# Patient Record
Sex: Female | Born: 1935 | Race: White | Hispanic: No | Smoking: Never smoker
Health system: Southern US, Community
[De-identification: ages and names within clinical notes are randomized; demographics above are authoritative.]

## PROBLEM LIST (undated history)

## (undated) DIAGNOSIS — R251 Tremor, unspecified: Secondary | ICD-10-CM

## (undated) DIAGNOSIS — I4891 Unspecified atrial fibrillation: Secondary | ICD-10-CM

## (undated) DIAGNOSIS — E785 Hyperlipidemia, unspecified: Secondary | ICD-10-CM

## (undated) DIAGNOSIS — I739 Peripheral vascular disease, unspecified: Secondary | ICD-10-CM

## (undated) DIAGNOSIS — F0390 Unspecified dementia without behavioral disturbance: Secondary | ICD-10-CM

## (undated) DIAGNOSIS — R350 Frequency of micturition: Secondary | ICD-10-CM

---

## 2014-06-08 ENCOUNTER — Inpatient Hospital Stay (HOSPITAL_BASED_OUTPATIENT_CLINIC_OR_DEPARTMENT_OTHER)
Admission: EM | Admit: 2014-06-08 | Discharge: 2014-06-11 | DRG: 872 | Disposition: A | Discharge status: Home / Self Care | Payer: Medicare Other | Attending: Internal Medicine | Admitting: Internal Medicine

## 2014-06-08 ENCOUNTER — Emergency Department (HOSPITAL_BASED_OUTPATIENT_CLINIC_OR_DEPARTMENT_OTHER): Payer: Medicare Other

## 2014-06-08 ENCOUNTER — Inpatient Hospital Stay (HOSPITAL_COMMUNITY): Payer: Medicare Other

## 2014-06-08 ENCOUNTER — Encounter (HOSPITAL_BASED_OUTPATIENT_CLINIC_OR_DEPARTMENT_OTHER): Payer: Self-pay | Admitting: Emergency Medicine

## 2014-06-08 DIAGNOSIS — Z79899 Other long term (current) drug therapy: Secondary | ICD-10-CM | POA: Diagnosis not present

## 2014-06-08 DIAGNOSIS — Z66 Do not resuscitate: Secondary | ICD-10-CM | POA: Diagnosis present

## 2014-06-08 DIAGNOSIS — F0390 Unspecified dementia without behavioral disturbance: Secondary | ICD-10-CM | POA: Diagnosis present

## 2014-06-08 DIAGNOSIS — I739 Peripheral vascular disease, unspecified: Secondary | ICD-10-CM | POA: Diagnosis present

## 2014-06-08 DIAGNOSIS — E785 Hyperlipidemia, unspecified: Secondary | ICD-10-CM | POA: Diagnosis present

## 2014-06-08 DIAGNOSIS — Z7901 Long term (current) use of anticoagulants: Secondary | ICD-10-CM

## 2014-06-08 DIAGNOSIS — R259 Unspecified abnormal involuntary movements: Secondary | ICD-10-CM | POA: Diagnosis present

## 2014-06-08 DIAGNOSIS — Z885 Allergy status to narcotic agent status: Secondary | ICD-10-CM | POA: Diagnosis not present

## 2014-06-08 DIAGNOSIS — Z888 Allergy status to other drugs, medicaments and biological substances status: Secondary | ICD-10-CM

## 2014-06-08 DIAGNOSIS — N12 Tubulo-interstitial nephritis, not specified as acute or chronic: Secondary | ICD-10-CM | POA: Diagnosis present

## 2014-06-08 DIAGNOSIS — I10 Essential (primary) hypertension: Secondary | ICD-10-CM | POA: Diagnosis present

## 2014-06-08 DIAGNOSIS — A419 Sepsis, unspecified organism: Secondary | ICD-10-CM | POA: Diagnosis present

## 2014-06-08 DIAGNOSIS — I4891 Unspecified atrial fibrillation: Secondary | ICD-10-CM | POA: Diagnosis present

## 2014-06-08 DIAGNOSIS — R109 Unspecified abdominal pain: Secondary | ICD-10-CM | POA: Diagnosis present

## 2014-06-08 HISTORY — DX: Unspecified atrial fibrillation: I48.91

## 2014-06-08 HISTORY — DX: Hyperlipidemia, unspecified: E78.5

## 2014-06-08 HISTORY — DX: Tremor, unspecified: R25.1

## 2014-06-08 HISTORY — DX: Peripheral vascular disease, unspecified: I73.9

## 2014-06-08 HISTORY — DX: Frequency of micturition: R35.0

## 2014-06-08 HISTORY — DX: Unspecified dementia, unspecified severity, without behavioral disturbance, psychotic disturbance, mood disturbance, and anxiety: F03.90

## 2014-06-08 LAB — URINALYSIS, ROUTINE W REFLEX MICROSCOPIC
BILIRUBIN URINE: NEGATIVE
Glucose, UA: NEGATIVE mg/dL
Ketones, ur: NEGATIVE mg/dL
Nitrite: POSITIVE — AB
PROTEIN: NEGATIVE mg/dL
Specific Gravity, Urine: 1.009 (ref 1.005–1.030)
UROBILINOGEN UA: 0.2 mg/dL (ref 0.0–1.0)
pH: 6 (ref 5.0–8.0)

## 2014-06-08 LAB — TROPONIN I

## 2014-06-08 LAB — BASIC METABOLIC PANEL
ANION GAP: 16 — AB (ref 5–15)
BUN: 18 mg/dL (ref 6–23)
CO2: 24 meq/L (ref 19–32)
Calcium: 10.3 mg/dL (ref 8.4–10.5)
Chloride: 100 mEq/L (ref 96–112)
Creatinine, Ser: 0.8 mg/dL (ref 0.50–1.10)
GFR calc Af Amer: 75 mL/min — ABNORMAL LOW (ref >90)
GFR calc non Af Amer: 64 mL/min — ABNORMAL LOW (ref >90)
Glucose, Bld: 128 mg/dL — ABNORMAL HIGH (ref 70–99)
Potassium: 4 mEq/L (ref 3.7–5.3)
SODIUM: 140 meq/L (ref 137–147)

## 2014-06-08 LAB — CBC WITH DIFFERENTIAL/PLATELET
BASOS ABS: 0 10*3/uL (ref 0.0–0.1)
Basophils Relative: 0 % (ref 0–1)
Eosinophils Absolute: 0.1 10*3/uL (ref 0.0–0.7)
Eosinophils Relative: 1 % (ref 0–5)
HCT: 40.6 % (ref 36.0–46.0)
Hemoglobin: 13 g/dL (ref 12.0–15.0)
LYMPHS ABS: 1.7 10*3/uL (ref 0.7–4.0)
LYMPHS PCT: 14 % (ref 12–46)
MCH: 30.4 pg (ref 26.0–34.0)
MCHC: 32 g/dL (ref 30.0–36.0)
MCV: 94.9 fL (ref 78.0–100.0)
Monocytes Absolute: 1 10*3/uL (ref 0.1–1.0)
Monocytes Relative: 8 % (ref 3–12)
NEUTROS ABS: 9.7 10*3/uL — AB (ref 1.7–7.7)
Neutrophils Relative %: 77 % (ref 43–77)
Platelets: 213 10*3/uL (ref 150–400)
RBC: 4.28 MIL/uL (ref 3.87–5.11)
RDW: 14.9 % (ref 11.5–15.5)
WBC: 12.5 10*3/uL — AB (ref 4.0–10.5)

## 2014-06-08 LAB — HEPATIC FUNCTION PANEL
ALBUMIN: 3.9 g/dL (ref 3.5–5.2)
ALT: 12 U/L (ref 0–35)
AST: 20 U/L (ref 0–37)
Alkaline Phosphatase: 92 U/L (ref 39–117)
Bilirubin, Direct: <0.2 mg/dL (ref 0.0–0.3)
Total Bilirubin: 0.9 mg/dL (ref 0.3–1.2)
Total Protein: 7.8 g/dL (ref 6.0–8.3)

## 2014-06-08 LAB — URINE MICROSCOPIC-ADD ON

## 2014-06-08 MED ORDER — DILTIAZEM HCL 25 MG/5ML IV SOLN
INTRAVENOUS | Status: AC
  Filled 2014-06-08: qty 20

## 2014-06-08 MED ORDER — AMIODARONE HCL IN DEXTROSE 360-4.14 MG/200ML-% IV SOLN
30.0000 mg/h | INTRAVENOUS | Status: DC
  Administered 2014-06-08 – 2014-06-09 (×2): 30 mg/h via INTRAVENOUS
  Filled 2014-06-08 (×7): qty 200

## 2014-06-08 MED ORDER — APIXABAN 2.5 MG PO TABS
2.5000 mg | ORAL_TABLET | Freq: Two times a day (BID) | ORAL | Status: DC
  Administered 2014-06-08 – 2014-06-11 (×6): 2.5 mg via ORAL
  Filled 2014-06-08 (×7): qty 1

## 2014-06-08 MED ORDER — ACETAMINOPHEN 325 MG PO TABS
650.0000 mg | ORAL_TABLET | Freq: Once | ORAL | Status: AC
  Administered 2014-06-08: 650 mg via ORAL

## 2014-06-08 MED ORDER — AMIODARONE HCL IN DEXTROSE 360-4.14 MG/200ML-% IV SOLN
60.0000 mg/h | INTRAVENOUS | Status: AC
Start: 2014-06-08 — End: 2014-06-08
  Administered 2014-06-08: 60 mg/h via INTRAVENOUS
  Filled 2014-06-08: qty 200

## 2014-06-08 MED ORDER — AMIODARONE LOAD VIA INFUSION
150.0000 mg | Freq: Once | INTRAVENOUS | Status: AC
  Administered 2014-06-08: 150 mg via INTRAVENOUS
  Filled 2014-06-08: qty 83.34

## 2014-06-08 MED ORDER — SODIUM CHLORIDE 0.9 % IV BOLUS (SEPSIS)
1000.0000 mL | Freq: Once | INTRAVENOUS | Status: AC
  Administered 2014-06-08: 1000 mL via INTRAVENOUS

## 2014-06-08 MED ORDER — APIXABAN 5 MG PO TABS
5.0000 mg | ORAL_TABLET | Freq: Two times a day (BID) | ORAL | Status: DC
  Filled 2014-06-08: qty 1

## 2014-06-08 MED ORDER — SODIUM CHLORIDE 0.9 % IV SOLN
INTRAVENOUS | Status: DC
  Administered 2014-06-08 – 2014-06-09 (×2): via INTRAVENOUS

## 2014-06-08 MED ORDER — ACETAMINOPHEN 325 MG PO TABS: 650.0000 mg | ORAL_TABLET | Freq: Four times a day (QID) | ORAL | Status: DC | PRN

## 2014-06-08 MED ORDER — LISINOPRIL 10 MG PO TABS
10.0000 mg | ORAL_TABLET | Freq: Every day | ORAL | Status: DC
  Administered 2014-06-08 – 2014-06-11 (×4): 10 mg via ORAL
  Filled 2014-06-08 (×4): qty 1

## 2014-06-08 MED ORDER — DILTIAZEM LOAD VIA INFUSION
15.0000 mg | Freq: Once | INTRAVENOUS | Status: AC
  Administered 2014-06-08: 15 mg via INTRAVENOUS
  Filled 2014-06-08: qty 15

## 2014-06-08 MED ORDER — DEXTROSE 5 % IV SOLN
1.0000 g | Freq: Once | INTRAVENOUS | Status: AC
  Administered 2014-06-08: 1 g via INTRAVENOUS

## 2014-06-08 MED ORDER — FUROSEMIDE 10 MG/ML IJ SOLN
20.0000 mg | Freq: Once | INTRAMUSCULAR | Status: AC
  Administered 2014-06-08: 20 mg via INTRAVENOUS
  Filled 2014-06-08: qty 2

## 2014-06-08 MED ORDER — DEXTROSE 5 % IV SOLN
1.0000 g | INTRAVENOUS | Status: DC
  Administered 2014-06-09 – 2014-06-11 (×3): 1 g via INTRAVENOUS
  Filled 2014-06-08 (×3): qty 10

## 2014-06-08 MED ORDER — LISINOPRIL 10 MG PO TABS: 10.0000 mg | ORAL_TABLET | Freq: Every day | ORAL | Status: DC

## 2014-06-08 MED ORDER — ALBUTEROL SULFATE (2.5 MG/3ML) 0.083% IN NEBU
INHALATION_SOLUTION | RESPIRATORY_TRACT | Status: AC
  Administered 2014-06-08: 2.5 mg
  Filled 2014-06-08: qty 6

## 2014-06-08 MED ORDER — ONDANSETRON HCL 4 MG/2ML IJ SOLN
4.0000 mg | Freq: Four times a day (QID) | INTRAMUSCULAR | Status: DC | PRN
  Administered 2014-06-10: 4 mg via INTRAVENOUS
  Filled 2014-06-08 (×2): qty 2

## 2014-06-08 MED ORDER — DEXTROSE 5 % IV SOLN
5.0000 mg/h | INTRAVENOUS | Status: DC
  Administered 2014-06-08: 5 mg/h via INTRAVENOUS
  Administered 2014-06-08: 15 mg/h via INTRAVENOUS
  Filled 2014-06-08: qty 100

## 2014-06-08 MED ORDER — APIXABAN 5 MG PO TABS
5.0000 mg | ORAL_TABLET | Freq: Two times a day (BID) | ORAL | Status: DC
  Administered 2014-06-08: 5 mg via ORAL
  Filled 2014-06-08 (×2): qty 1

## 2014-06-08 MED ORDER — ACETAMINOPHEN 325 MG PO TABS
ORAL_TABLET | ORAL | Status: AC
  Administered 2014-06-08: 650 mg via ORAL
  Filled 2014-06-08: qty 2

## 2014-06-08 MED ORDER — LEVALBUTEROL HCL 1.25 MG/0.5ML IN NEBU
1.2500 mg | INHALATION_SOLUTION | Freq: Four times a day (QID) | RESPIRATORY_TRACT | Status: DC | PRN
  Administered 2014-06-10: 1.25 mg via RESPIRATORY_TRACT
  Filled 2014-06-08: qty 0.5

## 2014-06-08 MED ORDER — CEFTRIAXONE SODIUM 1 G IJ SOLR
INTRAMUSCULAR | Status: AC
  Filled 2014-06-08: qty 10

## 2014-06-08 NOTE — Progress Notes (Signed)
Both Peripheral IV's have infiltrated.  IV Amiodarone gtt has been stopped for 1.5 hrs.  IV team notified.

## 2014-06-08 NOTE — ED Provider Notes (Signed)
CSN: [id number]     Arrival date & time 06/08/14  0203 History   First MD Initiated Contact with Patient 06/08/14 [phone]     Chief Complaint  Patient presents with  . Shortness of Breath     (Consider location/radiation/quality/duration/timing/severity/associated sxs/prior Treatment) HPI 80 year old female presents to emergency department from a local hotel with complaint of wheezing, shortness of breath, and shaking.  Family is in town for a wedding from [place].  They report that she had a normal day, had a Medicare pedicure.  Upon coming back to the hotel they were getting ready for bed and noticed that she was shaking so bad and thought it might be a seizure.  No change in all consciousness.  Patient has history of dementia.  They noticed the patient was wheezing.  Patient has history of atrial fibrillation, hyperlipidemia peripheral vascular disease and frequent urinary tract infections.  They report patient was recently switched from Coumadin to eloquent on Wednesday.  The ulcer report that she was recently taken off her Tikosyn as on her last evaluation by her cardiologist she did not have atrial fibrillation.  Patient has been complaining of abdominal pain.  Failure reports she has frequent urination, wears depends.  No recent antibiotics, but does have history of frequent urinary tract infections. Past Medical History  Diagnosis Date  . Atrial fibrillation   . Hyperlipemia   . PVD (peripheral vascular disease)   . Tremor   . Dementia   . Urinary frequency    History reviewed. No pertinent past surgical history. History reviewed. No pertinent family history. History  Substance Use Topics  . Smoking status: Never Smoker   . Smokeless tobacco: Not on file  . Alcohol Use: No   OB History   Grav Para Term Preterm Abortions TAB SAB Ect Mult Living                 Review of Systems  Unable to perform ROS: Dementia      Allergies  Advil; Codeine; Diovan; Iodine;  and Morphine and related  Home Medications   Prior to Admission medications   Medication Sig Start Date End Date Taking? Authorizing Provider  apixaban (ELIQUIS) 5 MG TABS tablet Take 5 mg by mouth 2 (two) times daily.   Yes Historical Provider, MD  calcium citrate-vitamin D (CITRACAL+D) 315-200 MG-UNIT per tablet Take 1 tablet by mouth 2 (two) times daily.   Yes Historical Provider, MD  fluticasone (FLONASE) 50 MCG/ACT nasal spray Place into both nostrils daily.   Yes Historical Provider, MD  lisinopril (PRINIVIL,ZESTRIL) 10 MG tablet Take 10 mg by mouth daily.   Yes Historical Provider, MD   BP 162/100  Pulse 155  Temp(Src) 101.3 F (38.5 C) (Oral)  Resp 18  SpO2 97% Physical Exam  Nursing note and vitals reviewed. Constitutional: She is oriented to person, place, and time. She appears well-developed and well-nourished. She appears distressed (uncomfortable appearing).  HENT:  Head: Normocephalic and atraumatic.  Right Ear: External ear normal.  Left Ear: External ear normal.  Nose: Nose normal.  Mouth/Throat: Oropharynx is clear and moist.  Eyes: Conjunctivae and EOM are normal. Pupils are equal, round, and reactive to light.  Neck: Normal range of motion. Neck supple. No JVD present. No tracheal deviation present. No thyromegaly present.  Cardiovascular: Normal rate, regular rhythm, normal heart sounds and intact distal pulses.  Exam reveals no gallop and no friction rub.   No murmur heard. Pulmonary/Chest: Effort normal and breath sounds normal.  No stridor. No respiratory distress. She has no wheezes. She has no rales. She exhibits no tenderness.  Abdominal: Soft. Bowel sounds are normal. She exhibits distension (abdomen is distended and firm). She exhibits no mass. There is tenderness (diffuse tenderness worse in left mid abdomen and left lower quadrant). There is no rebound and no guarding.  Musculoskeletal: Normal range of motion. She exhibits no edema and no tenderness.   Lymphadenopathy:    She has no cervical adenopathy.  Neurological: She is alert and oriented to person, place, and time. She exhibits normal muscle tone. Coordination normal.  Skin: Skin is warm and dry. No rash noted. No erythema. No pallor.  Psychiatric: She has a normal mood and affect. Her behavior is normal. Judgment and thought content normal.    ED Course  Procedures (including critical care time) Labs Review Labs Reviewed  CBC WITH DIFFERENTIAL - Abnormal; Notable for the following:    WBC 12.5 (*)    Neutro Abs 9.7 (*)    All other components within normal limits  BASIC METABOLIC PANEL - Abnormal; Notable for the following:    Glucose, Bld 128 (*)    GFR calc non Af Amer 64 (*)    GFR calc Af Amer 75 (*)    Anion gap 16 (*)    All other components within normal limits  URINALYSIS, ROUTINE W REFLEX MICROSCOPIC - Abnormal; Notable for the following:    APPearance TURBID (*)    Hgb urine dipstick LARGE (*)    Nitrite POSITIVE (*)    Leukocytes, UA LARGE (*)    All other components within normal limits  URINE MICROSCOPIC-ADD ON - Abnormal; Notable for the following:    Bacteria, UA MANY (*)    All other components within normal limits  URINE CULTURE  TROPONIN I  HEPATIC FUNCTION PANEL    Imaging Review Dg Chest Portable 1 View  06/08/2014   CLINICAL DATA:  SHORTNESS OF BREATH SHORTNESS OF BREATH  EXAM: PORTABLE CHEST - 1 VIEW  COMPARISON:  None available  FINDINGS: Transverse heart size is at the upper limits of normal. Mediastinal silhouette within normal limits. Atherosclerotic calcifications present within the aortic arch.  The lungs are normally inflated. No airspace consolidation, pleural effusion, or pulmonary edema is identified. There is no pneumothorax.  No acute osseous abnormality identified.  IMPRESSION: No active cardiopulmonary disease.   Electronically Signed   By: Rise Mu M.D.   On: 06/08/2014 03:00     EKG Interpretation   Date/Time:   Friday June 08 2014 02:23:29 EDT Ventricular Rate:  158 PR Interval:    QRS Duration: 84 QT Interval:  318 QTC Calculation: 515 R Axis:   85 Text Interpretation:  Atrial fibrillation with rapid ventricular response  with premature ventricular or aberrantly conducted complexes Septal  infarct , age undetermined Abnormal ECG No old tracing to compare  Confirmed by Nikesh Teschner  MD, Rontrell Moquin ([id number]) on 06/08/2014 2:36:15 AM     CRITICAL CARE Performed by: Olivia Mackie Total critical care time: 30 min Critical care time was exclusive of separately billable procedures and treating other patients. Critical care was necessary to treat or prevent imminent or life-threatening deterioration. Critical care was time spent personally by me on the following activities: development of treatment plan with patient and/or surrogate as well as nursing, discussions with consultants, evaluation of patient's response to treatment, examination of patient, obtaining history from patient or surrogate, ordering and performing treatments and interventions, ordering and review of laboratory studies,  ordering and review of radiographic studies, pulse oximetry and re-evaluation of patient's condition.  MDM   Final diagnoses:  Pyelonephritis  Atrial fibrillation with rapid ventricular response    80 year old female who presents after shaking, or wheezing.  Patient noted to be in atrial fibrillation with RVR, heart rate in the 150s.  She is febrile to 101.3.  Patient has diffuse tenderness on palpation of the abdomen worse on the left side.  Urine with infection noted.  Will treat fever, give fluids.  May require some rate control if he does not improve with fever reduction.  As abdomen is distended and tender, we'll get CT abdomen pelvis.  Failure reports her history of diverticulitis.  There is an allergy to iodine, but neither patient nor family knows what the reaction is.  We'll do CT without IV contrast oral contrast  only.  5:09 AM Some improvement in HR with reduction in fever and fluid bolus, but still persistently in 130s.  Improved to 100s with diltiazem.  CT scan shows probably pyelo given fever, elevated wbc with UTI.  Will start rocephin and d/w hospitalist for admission.  Will update family when they return.  Olivia Mackie, MD 06/08/14 [phone]

## 2014-06-08 NOTE — Care Management Note (Signed)
    Page 1 of 1   06/11/2014     2:41:48 PM CARE MANAGEMENT NOTE 06/11/2014  Patient:  Yvonne Gates, Yvonne Gates   Account Number:  [id number]  Date Initiated:  06/08/2014  Documentation initiated by:  GRAVES-BIGELOW,Jaquawn Saffran  Subjective/Objective Assessment:   Pt admitted with chills and abdominal pain.     Action/Plan:   CM will continue to monitor for disposition needs.   Anticipated DC Date:  06/11/2014   Anticipated DC Plan:  HOME W HOME HEALTH SERVICES      DC Planning Services  CM consult      Choice offered to / List presented to:             Status of service:  Completed, signed off Medicare Important Message given?  YES (If response is "NO", the following Medicare IM given date fields will be blank) Date Medicare IM given:  06/11/2014 Medicare IM given by:  GRAVES-BIGELOW,Aarik Blank Date Additional Medicare IM given:   Additional Medicare IM given by:    Discharge Disposition:  HOME/SELF CARE  Per UR Regulation:  Reviewed for med. necessity/level of care/duration of stay  If discussed at Long Length of Stay Meetings, dates discussed:    Comments:

## 2014-06-08 NOTE — ED Notes (Signed)
Daughter Duard Larsen contact number [phone].

## 2014-06-08 NOTE — Progress Notes (Signed)
UR Completed Jolina Symonds Graves-Bigelow, RN,BSN [phone]  

## 2014-06-08 NOTE — H&P (Signed)
[facility] History and Physical  Yvonne Gates ZOX:[id number] DOB: [birth year] DOA: 06/08/2014  Referring physician: EDP PCP: Pcp Not In System   Chief Complaint: chills, abd pain  HPI: Yvonne Gates is a 80 y.o. female with PMH of Dementia, H/o Afib, HTN, tremors resident of ALF is visiting [place] for a wedding from [place] with her daughter. She was doing welll during the day yetserday and last night started experiencing chills, L sided abd pain, frequent urination and some wheezing. -Her daughter brought her to the ER and was found to be febrile with temp of 101, Afib with RVR, leukocytosis and UTI with pyelonephritis on CT. Off note, she saw her cardiologist last week in [place] and her Tikosyn was stopped and switched from Coumadin to Apixaban   Review of Systems: positives bolded Constitutional:  No weight loss, night sweats, Fevers, chills, fatigue.  HEENT:  No headaches, Difficulty swallowing,Tooth/dental problems,Sore throat,  No sneezing, itching, ear ache, nasal congestion, post nasal drip,  Cardio-vascular:  No chest pain, Orthopnea, PND, swelling in lower extremities, anasarca, dizziness, palpitations  GI:  No heartburn, indigestion, abdominal pain, nausea, vomiting, diarrhea, change in bowel habits, loss of appetite  Resp:   shortness of breath with exertion or at rest. No excess mucus, no productive cough, No non-productive cough, No coughing up of blood.No change in color of mucus.No wheezing.No chest wall deformity  Skin:  no rash or lesions.  GU:  no dysuria, change in color of urine, no urgency or frequency. No flank pain.  Musculoskeletal:  No joint pain or swelling. No decreased range of motion. No back pain.  Psych:  No change in mood or affect. No depression or anxiety. No memory loss.   Past Medical History  Diagnosis Date  . Atrial fibrillation   . Hyperlipemia   . PVD (peripheral vascular disease)   . Tremor   . Dementia   . Urinary  frequency    History reviewed. No pertinent past surgical history. Social History:  reports that she has never smoked. She does not have any smokeless tobacco history on file. She reports that she does not drink alcohol. Her drug history is not on file.  Allergies  Allergen Reactions  . Advil [Ibuprofen]   . Codeine   . Diovan [Valsartan]   . Iodine   . Morphine And Related     History reviewed. No pertinent family history.   Prior to Admission medications   Medication Sig Start Date End Date Taking? Authorizing Provider  apixaban (ELIQUIS) 5 MG TABS tablet Take 5 mg by mouth 2 (two) times daily.   Yes Historical Provider, MD  calcium citrate-vitamin D (CITRACAL+D) 315-200 MG-UNIT per tablet Take 1 tablet by mouth 2 (two) times daily.   Yes Historical Provider, MD  fluticasone (FLONASE) 50 MCG/ACT nasal spray Place into both nostrils daily.   Yes Historical Provider, MD  lisinopril (PRINIVIL,ZESTRIL) 10 MG tablet Take 10 mg by mouth daily.   Yes Historical Provider, MD   Physical Exam: Filed Vitals:   06/08/14 0600 06/08/14 0613 06/08/14 0624 06/08/14 0705  BP: 150/97 126/81  151/72  Pulse: 95   84  Temp:  98.4 F (36.9 C)  97.6 F (36.4 C)  TempSrc:  Oral  Oral  Resp: Height:     (1.575 m)  Weight:   65.772 kg (145 lb) 65.953 kg (145 lb 6.4 oz)  SpO2: 95% 96%  99%    Wt Readings from Last 3  Encounters:  06/08/14 65.953 kg (145 lb 6.4 oz)    General:  Appears calm and comfortable, alert, awake, oriented to self only Eyes: PERRL, normal lids, irises & conjunctiva ENT: grossly normal lips & tongue Neck: no LAD, masses or thyromegaly Cardiovascular: Irregular rate and rhythm, no m/r/g. No LE edema. Respiratory: CTA bilaterally, no w/r/r. Normal respiratory effort. Abdomen: soft, BS present, mild L flank tednerness Skin: dry flaky skin with discoloration of lower ext Musculoskeletal: grossly normal tone BUE/BLE Psychiatric: grossly normal mood and  affect, speech fluent and appropriate Neurologic: grossly non-focal.          Labs on Admission:  Basic Metabolic Panel:  Recent Labs Lab 06/08/14 0221  NA 140  K 4.0  CL 100  CO2 24  GLUCOSE 128*  BUN 18  CREATININE 0.80  CALCIUM 10.3   Liver Function Tests:  Recent Labs Lab 06/08/14 0356  AST 20  ALT 12  ALKPHOS 92  BILITOT 0.9  PROT 7.8  ALBUMIN 3.9   No results found for this basename: LIPASE, AMYLASE,  in the last 168 hours No results found for this basename: AMMONIA,  in the last 168 hours CBC:  Recent Labs Lab 06/08/14 0221  WBC 12.5*  NEUTROABS 9.7*  HGB 13.0  HCT 40.6  MCV 94.9  PLT 213   Cardiac Enzymes:  Recent Labs Lab 06/08/14 0221  TROPONINI <0.30    BNP (last 3 results) No results found for this basename: PROBNP,  in the last 8760 hours CBG: No results found for this basename: GLUCAP,  in the last 168 hours  Radiological Exams on Admission: Ct Abdomen Pelvis Wo Contrast  06/08/2014   CLINICAL DATA:  Fever, abdominal pain  EXAM: CT ABDOMEN AND PELVIS WITHOUT CONTRAST  TECHNIQUE: Multidetector CT imaging of the abdomen and pelvis was performed following the standard protocol without IV contrast.  COMPARISON:  None.  FINDINGS: Subsegmental atelectasis seen dependently within the visualized lung bases. Mild cardiomegaly partially visualized. No pleural or pericardial effusion.  Limited noncontrast evaluation of the liver is unremarkable. Gallbladder within normal limits. No biliary dilatation. The spleen, adrenal glands, and pancreas demonstrate a normal unenhanced appearance.  Right kidney within normal limits without evidence of nephrolithiasis or hydronephrosis.  The left kidney is mildly enlarged as compared to the contralateral right with subtle perinephric fat stranding. Mild periureteral stranding seen as well no definite obstructive stone seen along the course of the left renal collecting system. Findings may reflect underlying infection  given the history of abdominal pain and fever.  Small hiatal hernia noted. No evidence of bowel obstruction. No abnormal wall thickening or inflammatory changes seen about the bowels. Scattered colonic diverticular present without acute diverticulitis. No evidence for appendicitis.  Bladder not well evaluated due to extensive streak artifact from bilateral total hip arthroplasties. Similarly, pelvic viscera are not well seen.  No free air or fluid. No adenopathy. Moderate aorto bi-iliac atherosclerotic calcifications present. No aneurysm.  No acute osseous abnormality. No worrisome lytic or blastic osseous lesions.  IMPRESSION: 1. Mild left perinephric and periureteral fat stranding, suggestive of possible urinary tract infection. Correlation with urinalysis recommended. No hydronephrosis. 2. No other acute intra-abdominal or pelvic process identified. 3. Colonic diverticulosis without acute diverticulitis. 4. Bilateral hip arthroplasties in place. 5. Moderate aorto bi-iliac atherosclerotic disease. No intra-abdominal aneurysm. 6. Small hiatal hernia.   Electronically Signed   By: Rise Mu M.D.   On: 06/08/2014 05:02   Dg Chest Portable 1 View  06/08/2014  CLINICAL DATA:  SHORTNESS OF BREATH SHORTNESS OF BREATH  EXAM: PORTABLE CHEST - 1 VIEW  COMPARISON:  None available  FINDINGS: Transverse heart size is at the upper limits of normal. Mediastinal silhouette within normal limits. Atherosclerotic calcifications present within the aortic arch.  The lungs are normally inflated. No airspace consolidation, pleural effusion, or pulmonary edema is identified. There is no pneumothorax.  No acute osseous abnormality identified.  IMPRESSION: No active cardiopulmonary disease.   Electronically Signed   By: Rise Mu M.D.   On: 06/08/2014 03:00    EKG: Independently reviewed. Afib with RVR  Assessment/Plan Principal Problem:   Sepsis -due to Pyelonephritis -improving, FU urine Cx -continue  Iv ceftriaxone    Atrial fibrillation with RVR -was on Tikosyn, stopped last week per Cards in [place] -on Cardizem gtt now at /hr -will ask cards to eval for anti-arrhythmic recommendations -continue apixaban    Dementia -stable    HTn -stable, continue lisinopril  Code Status: DNR DVT Prophylaxis: on apixaban Family Communication: d/w daughter at bedside Disposition Plan: inpatient Time spent:  [facility] Pager [phone]  **Disclaimer: This note may have been dictated with voice recognition software. Similar sounding words can inadvertently be transcribed and this note may contain transcription errors which may not have been corrected upon publication of note.**

## 2014-06-08 NOTE — Evaluation (Signed)
Received call from EDP at [facility] about pt with fever, abdominal pain, A. fib with RVR. Patient is visiting from her granddaughter's wedding. Abdominal pain and fever x1 day. Presented to [facility] at [facility] 101.3, heart rate into the 160s. CT abd/UA concerning for pyelo/UTI. WBC 12.5. Started on rocephin. Also with afib w/ rvr. Started on dilt drip. HR now in 110s. (baseline hx/o afib-saw cards recently-changed to eliquus and one of  Her anti-arrhythmics stopped). Family was supposed to be right back with meds, but has not returned. Did not give contact number. BPs in 130s-160s. EDP feels pt appropriate for tele bed. Team 10.  Active Issues: Sepsis Pyelo/UTI Afib w/ RVR

## 2014-06-08 NOTE — ED Notes (Signed)
Daughter back at bedside, updated in results of testing and anticipated transfer to [facility].

## 2014-06-08 NOTE — ED Notes (Signed)
Patient's daughter contacted and notified of poc and that patient would be transferred to hospital. Jasmine December in agreement and states that she will be here prior to tx within the next 30 minutes

## 2014-06-08 NOTE — ED Notes (Signed)
Pt states she awoke around 11pm shaking like she was cold, at that time family felt she was wheezing, family brought her to er to be evaluated

## 2014-06-08 NOTE — Consult Note (Addendum)
CARDIOLOGY CONSULT NOTE   Patient ID: Yvonne Gates MRN: [id number] DOB/AGE: 1924/06/13 80 y.o.  Admit date: 06/08/2014  Primary Physician   Pcp Not In System Primary Cardiologist   New  Reason for Consultation  Afib with RVR  HPI: Yvonne Gates is a 80 y.o. ALF resident female with PMH of dementia, paroxysmal Afib, HTN, and tremors who is visiting from [place], [place] for granddaughter's wedding who presented to [facility] today with fevers, chills, abdominal pain and palpitations and found to have sepsis due to pyelonephritis and afib with RVR.  She was in her usual state of health until last night when she started experiencing chills, L sided abd pain, frequent urination and some SOB/wheezing. Her daughter brought her to the ED and was found to be febrile with temp of 101, Afib with RVR, leukocytosis and UTI with pyelonephritis on CT. Of note, she saw her cardiologist last week in [facility] and her Tikosyn was stopped and she was switched from Coumadin to apixaban. It is unclear why she was taken off her Tikosyn but it sounds like she was doing well and they felt it was no longer necessary. She denies palpitations, chest pain, SOB, orthopnea, PND or LE edema currently. She is unaware of her dysrhythmia when rate is controlled. History is difficult due to mild dementia. She does not know the name of her cardiologist or the practice. Daughter not present in room. I called her daughter who explained that she was originally placed on Tiksosyn by a cardiologist in [facility] and more recently transferred care to a Dr. Sherryll Burger with [facility] in [facility]. She needed a refill for her Tikosyn and called Dr. Sherryll Burger who said she would need an ECG in the office before refill ling the medication ( likely to measure QT). They then received a phone call after the ECG telling her that she should stop the Tikosyn. She is unsure why. She does not know the last time her mother was in afib and she  has been on [place] for     Past Medical History  Diagnosis Date  . Atrial fibrillation   . Hyperlipemia   . PVD (peripheral vascular disease)   . Tremor   . Dementia   . Urinary frequency      History reviewed. No pertinent past surgical history.  Allergies  Allergen Reactions  . Advil [Ibuprofen] Other (See Comments)    Unknown;  . Codeine Other (See Comments)    unknown  . Diovan [Valsartan] Other (See Comments)    unknown  . Iodine Other (See Comments)    unknown  . Morphine And Related Other (See Comments)    unknown    I have reviewed the patient's current medications . apixaban  5 mg Oral BID  . [START ON 06/09/2014] cefTRIAXone (ROCEPHIN)  IV  1 g Intravenous Q24H  . lisinopril  10 mg Oral Daily   . sodium chloride 100 mL/hr at 06/08/14 0751  . diltiazem (CARDIZEM) infusion 15 mg/hr (06/08/14 1204)   acetaminophen, ondansetron (ZOFRAN) IV  Prior to Admission medications   Medication Sig Start Date End Date Taking? Authorizing Provider  apixaban (ELIQUIS) 5 MG TABS tablet Take 5 mg by mouth 2 (two) times daily.   Yes Historical Provider, MD  calcium citrate-vitamin D (CITRACAL+D) 315-200 MG-UNIT per tablet Take 1 tablet by mouth 2 (two) times daily.   Yes Historical Provider, MD  fluticasone (FLONASE) 50 MCG/ACT nasal spray Place 1 spray into  both nostrils daily.    Yes Historical Provider, MD  lisinopril (PRINIVIL,ZESTRIL) 10 MG tablet Take 10 mg by mouth daily.   Yes Historical Provider, MD     History   Social History  . Marital Status: Widowed    Spouse Name: N/A    Number of Children: N/A  . Years of Education: N/A   Occupational History  . Not on file.   Social History Main Topics  . Smoking status: Never Smoker   . Smokeless tobacco: Not on file  . Alcohol Use: No  . Drug Use: Not on file  . Sexual Activity: Not on file   Other Topics Concern  . Not on file   Social History Narrative  . No narrative on file    No family status  information on file.   History reviewed. No pertinent family history.   ROS:  Full 14 point review of systems complete and found to be negative unless listed above.  Physical Exam: Blood pressure 151/72, pulse 84, temperature 97.6 F (36.4 C), temperature source Oral, resp. rate 18, height  (1.575 m), weight 145 lb 6.4 oz (65.953 kg), SpO2 99.00%.  General: Well developed, well nourished, female in no acute distress. Frail Head: Eyes PERRLA, No xanthomas.   Normocephalic and atraumatic, oropharynx without edema or exudate. Dentition:  Lungs: decreased breath sounds Heart: HRRR S1 S2, no rub/gallop, Heart irregular rate and rhythm with S1, S2  murmur. pulses are 2+ extrem.   Neck: No carotid bruits. No lymphadenopathy.  No JVD. Abdomen: Bowel sounds present, abdomen soft and non-tender without masses or hernias noted. Msk:  No spine or cva tenderness. No weakness, no joint deformities or effusions. Extremities: No clubbing or cyanosis. NO  Edema. Chronic pre-tibial changes of venous insufficiency  Neuro: Alert and oriented X 3. No focal deficits noted. Psych:  Good affect, responds appropriately Skin: No rashes or lesions noted.  Labs:   Lab Results  Component Value Date   WBC 12.5* 06/08/2014   HGB 13.0 06/08/2014   HCT 40.6 06/08/2014   MCV 94.9 06/08/2014   PLT 213 06/08/2014   No results found for this basename: INR,  in the last 72 hours  Recent Labs Lab 06/08/14 0221 06/08/14 0356  NA 140  --   K 4.0  --   CL 100  --   CO2 24  --   BUN 18  --   CREATININE 0.80  --   CALCIUM 10.3  --   PROT  --  7.8  BILITOT  --  0.9  ALKPHOS  --  92  ALT  --  12  AST  --  20  GLUCOSE 128*  --   ALBUMIN  --  3.9    Recent Labs  06/08/14 0221  TROPONINI <0.30    Echo: none  ECG:  afib with RVR. HR 158  Radiology:  Ct Abdomen Pelvis Wo Contrast  06/08/2014   CLINICAL DATA:  Fever, abdominal pain  EXAM: CT ABDOMEN AND PELVIS WITHOUT CONTRAST  TECHNIQUE: Multidetector CT  imaging of the abdomen and pelvis was performed following the standard protocol without IV contrast.  COMPARISON:  None.  FINDINGS: Subsegmental atelectasis seen dependently within the visualized lung bases. Mild cardiomegaly partially visualized. No pleural or pericardial effusion.  Limited noncontrast evaluation of the liver is unremarkable. Gallbladder within normal limits. No biliary dilatation. The spleen, adrenal glands, and pancreas demonstrate a normal unenhanced appearance.  Right kidney within normal limits without evidence of  nephrolithiasis or hydronephrosis.  The left kidney is mildly enlarged as compared to the contralateral right with subtle perinephric fat stranding. Mild periureteral stranding seen as well no definite obstructive stone seen along the course of the left renal collecting system. Findings may reflect underlying infection given the history of abdominal pain and fever.  Small hiatal hernia noted. No evidence of bowel obstruction. No abnormal wall thickening or inflammatory changes seen about the bowels. Scattered colonic diverticular present without acute diverticulitis. No evidence for appendicitis.  Bladder not well evaluated due to extensive streak artifact from bilateral total hip arthroplasties. Similarly, pelvic viscera are not well seen.  No free air or fluid. No adenopathy. Moderate aorto bi-iliac atherosclerotic calcifications present. No aneurysm.  No acute osseous abnormality. No worrisome lytic or blastic osseous lesions.  IMPRESSION: 1. Mild left perinephric and periureteral fat stranding, suggestive of possible urinary tract infection. Correlation with urinalysis recommended. No hydronephrosis. 2. No other acute intra-abdominal or pelvic process identified. 3. Colonic diverticulosis without acute diverticulitis. 4. Bilateral hip arthroplasties in place. 5. Moderate aorto bi-iliac atherosclerotic disease. No intra-abdominal aneurysm. 6. Small hiatal hernia.   Electronically  Signed   By: Rise Mu M.D.   On: 06/08/2014 05:02   Dg Chest Portable 1 View  06/08/2014   CLINICAL DATA:  SHORTNESS OF BREATH SHORTNESS OF BREATH  EXAM: PORTABLE CHEST - 1 VIEW  COMPARISON:  None available  FINDINGS: Transverse heart size is at the upper limits of normal. Mediastinal silhouette within normal limits. Atherosclerotic calcifications present within the aortic arch.  The lungs are normally inflated. No airspace consolidation, pleural effusion, or pulmonary edema is identified. There is no pneumothorax.  No acute osseous abnormality identified.  IMPRESSION: No active cardiopulmonary disease.   Electronically Signed   By: Rise Mu M.D.   On: 06/08/2014 03:00    ASSESSMENT AND PLAN:    Principal Problem:   Sepsis Active Problems:   Pyelonephritis   Atrial fibrillation with RVR   Dementia   Yvonne Gates is a 80 y.o. ALF resident female with PMH of dementia, paroxysmal Afib, HTN, and tremors who is visiting from [place], [place] for granddaughter's wedding who presented to [facility] today with fevers, chills, abdominal pain and palpitations and found to have sepsis due to pyelonephritis and afib with RVR.  Sepsis  -- Due to Pyelonephritis and improving with IV Abx  Atrial fibrillation with RVR -on Cardizem gtt now at /hr. HR currently controlled in 80-90s. Patient feeling much better.  -- Was on Tikosyn, stopped last week per Cards in [place] likely due to prolonged QT. QTc 515 on ECG here.  -- On apixaban  BID. She should be on 2.5 mg due to her age and weight. Will change this -- Will start amiodarone bolus and infusion. -- Order 2D ECHO  Long QT-  QTc 515 on ECG here. Tikosyn stopped appropriately. Will start amiodarone for afib.  Dementia  -- Stable.  HTN -- Stable, continue lisinopril     Signed: Allena Katz 06/08/2014 12:49 PM  Pager [phone]  Co-Sign MD   Patient seen and examined with Carlean Jews, PA-C.  We discussed all aspects of the encounter. I agree with the assessment and plan as stated above. Elderly woman with significant dementia and PAF. Recently stopped Tikosyn due to prolonged QT. Admitted with urosepsis complicated by recurrent AF with RVR. Will start IV amiodarone. Check echo. Suspect she will need oral amio at home. Decrease apixaban 2.5 bid  Irl Bodie,MD 2:30 PM

## 2014-06-08 NOTE — ED Notes (Signed)
Patient transported to CT via stretcher per tech. 

## 2014-06-08 NOTE — ED Notes (Signed)
Attempted to call report to [facility], RN unavailable for report. 

## 2014-06-08 NOTE — ED Notes (Signed)
MD at bedside. 

## 2014-06-09 ENCOUNTER — Inpatient Hospital Stay (HOSPITAL_COMMUNITY): Payer: Medicare Other

## 2014-06-09 DIAGNOSIS — I059 Rheumatic mitral valve disease, unspecified: Secondary | ICD-10-CM

## 2014-06-09 LAB — CBC
HEMATOCRIT: 38.3 % (ref 36.0–46.0)
HEMOGLOBIN: 12 g/dL (ref 12.0–15.0)
MCH: 29.3 pg (ref 26.0–34.0)
MCHC: 31.3 g/dL (ref 30.0–36.0)
MCV: 93.4 fL (ref 78.0–100.0)
Platelets: 215 10*3/uL (ref 150–400)
RBC: 4.1 MIL/uL (ref 3.87–5.11)
RDW: 15.1 % (ref 11.5–15.5)
WBC: 10.6 10*3/uL — ABNORMAL HIGH (ref 4.0–10.5)

## 2014-06-09 LAB — BASIC METABOLIC PANEL
Anion gap: 15 (ref 5–15)
BUN: 9 mg/dL (ref 6–23)
CALCIUM: 9.1 mg/dL (ref 8.4–10.5)
CO2: 23 mEq/L (ref 19–32)
Chloride: 101 mEq/L (ref 96–112)
Creatinine, Ser: 0.65 mg/dL (ref 0.50–1.10)
GFR calc Af Amer: 90 mL/min — ABNORMAL LOW (ref >90)
GFR calc non Af Amer: 78 mL/min — ABNORMAL LOW (ref >90)
GLUCOSE: 136 mg/dL — AB (ref 70–99)
Potassium: 3.5 mEq/L — ABNORMAL LOW (ref 3.7–5.3)
Sodium: 139 mEq/L (ref 137–147)

## 2014-06-09 MED ORDER — SODIUM CHLORIDE 0.9 % IJ SOLN: 10.0000 mL | Freq: Two times a day (BID) | INTRAMUSCULAR | Status: DC

## 2014-06-09 MED ORDER — CARVEDILOL 3.125 MG PO TABS
3.1250 mg | ORAL_TABLET | Freq: Two times a day (BID) | ORAL | Status: DC
  Administered 2014-06-09 – 2014-06-11 (×4): 3.125 mg via ORAL
  Filled 2014-06-09 (×7): qty 1

## 2014-06-09 MED ORDER — POTASSIUM CHLORIDE CRYS ER 20 MEQ PO TBCR
20.0000 meq | EXTENDED_RELEASE_TABLET | Freq: Once | ORAL | Status: AC
  Administered 2014-06-09: 20 meq via ORAL
  Filled 2014-06-09: qty 1

## 2014-06-09 MED ORDER — SODIUM CHLORIDE 0.9 % IJ SOLN
10.0000 mL | INTRAMUSCULAR | Status: DC | PRN
  Administered 2014-06-10 – 2014-06-11 (×3): 10 mL

## 2014-06-09 MED ORDER — AMIODARONE HCL 200 MG PO TABS
400.0000 mg | ORAL_TABLET | Freq: Two times a day (BID) | ORAL | Status: DC
  Administered 2014-06-09 – 2014-06-11 (×4): 400 mg via ORAL
  Filled 2014-06-09 (×5): qty 2

## 2014-06-09 NOTE — Progress Notes (Signed)
[facility] PROGRESS NOTE  Yvonne Gates ZOX:[id number] DOB: 11-29-1923 DOA: 06/08/2014 PCP: Pcp Not In System  Assessment/Plan: 1. Atrial fibrillation with rapid ventricular response. -Patient patient presenting with ventricular rates in the 140s. -She had previously been on anti-consent which was discontinued about a week ago by her cardiologist in [facility] given prolonged QT. -She was started on amiodarone drip with improvement in heart rates -I suspect underlying infectious process contributing to development of A. fib with RVR -Cardiology following  2.  Sepsis -Present on admission, evidenced by a white count of 12,500, heart rate of 150, respiratory rate of 27 -Infectious source likely to be a urinary tract infection -Continue empiric IV antimicrobial therapy with ceftriaxone 1 g IV every 24 hours  3. Urinary tract infection -Urinalysis showing the presence of many bacteria, nitrates and leukocyte Estrace. -Pending urine cultures -Meanwhile will continue Rocephin 1 g IV every 12 hours as she appears to be showing clinical improvement.  4. Hypertension -Stable  Code Status: DO NOT RESUSCITATE Family Communication: Spoke with patient's daughter Disposition Plan: Continue monitoring on telemetry, remains on amiodarone drip   Consultants:  Cardiology  Antibiotics:  Ceftriaxone gram IV every 24 hours  HPI/Subjective: Patient is a pleasant 80 year old female with a history of chronic atrial fibrillation, admitted to the medicine service on 06/08/2014. Presenting with Afib with RVR.   Objective: Filed Vitals:   06/09/14 1444  BP: 95/63  Pulse: 73  Temp: 98.6 F (37 C)  Resp: 17    Intake/Output Summary (Last 24 hours) at 06/09/14 1503 Last data filed at 06/09/14 0700  Gross per 24 hour  Intake    303 ml  Output    200 ml  Net    103 ml   Filed Weights   06/08/14 0624 06/08/14 0705  Weight: 65.772 kg (145 lb) 65.953 kg (145 lb 6.4 oz)     Exam:   General:  Patient is in no acute distress, she is sitting at bedside, tolerating by mouth intake. She is awake alert, pleasant  Cardiovascular: Irregular rate and rhythm, tachycardic normal S1-S2, no edema to extremities  Respiratory: Patient having normal respiratory effort, lungs overall clear to auscultation bilaterally  Abdomen: Soft nontender nondistended  Musculoskeletal: No edema  Data Reviewed: Basic Metabolic Panel:  Recent Labs Lab 06/08/14 0221 06/09/14 0530  NA 140 139  K 4.0 3.5*  CL 100 101  CO2 24 23  GLUCOSE 128* 136*  BUN 18 9  CREATININE 0.80 0.65  CALCIUM 10.3 9.1   Liver Function Tests:  Recent Labs Lab 06/08/14 0356  AST 20  ALT 12  ALKPHOS 92  BILITOT 0.9  PROT 7.8  ALBUMIN 3.9   No results found for this basename: LIPASE, AMYLASE,  in the last 168 hours No results found for this basename: AMMONIA,  in the last 168 hours CBC:  Recent Labs Lab 06/08/14 0221 06/09/14 0530  WBC 12.5* 10.6*  NEUTROABS 9.7*  --   HGB 13.0 12.0  HCT 40.6 38.3  MCV 94.9 93.4  PLT 213 215   Cardiac Enzymes:  Recent Labs Lab 06/08/14 0221  TROPONINI <0.30   BNP (last 3 results) No results found for this basename: PROBNP,  in the last 8760 hours CBG: No results found for this basename: GLUCAP,  in the last 168 hours  Recent Results (from the past 240 hour(s))  URINE CULTURE     Status: None   Collection Time    06/08/14  2:31 AM  Result Value Ref Range Status   Specimen Description URINE, CATHETERIZED   Final   Special Requests NONE   Final   Culture  Setup Time     Final   Value: 06/08/2014 08:54     Performed at [facility]   Colony Count     Final   Value: >=100,000 COLONIES/ML     Performed at [facility]   Culture     Final   Value: ESCHERICHIA COLI     Performed at [facility]   Report Status PENDING   Incomplete     Studies: Ct Abdomen Pelvis Wo Contrast  06/08/2014   CLINICAL DATA:   Fever, abdominal pain  EXAM: CT ABDOMEN AND PELVIS WITHOUT CONTRAST  TECHNIQUE: Multidetector CT imaging of the abdomen and pelvis was performed following the standard protocol without IV contrast.  COMPARISON:  None.  FINDINGS: Subsegmental atelectasis seen dependently within the visualized lung bases. Mild cardiomegaly partially visualized. No pleural or pericardial effusion.  Limited noncontrast evaluation of the liver is unremarkable. Gallbladder within normal limits. No biliary dilatation. The spleen, adrenal glands, and pancreas demonstrate a normal unenhanced appearance.  Right kidney within normal limits without evidence of nephrolithiasis or hydronephrosis.  The left kidney is mildly enlarged as compared to the contralateral right with subtle perinephric fat stranding. Mild periureteral stranding seen as well no definite obstructive stone seen along the course of the left renal collecting system. Findings may reflect underlying infection given the history of abdominal pain and fever.  Small hiatal hernia noted. No evidence of bowel obstruction. No abnormal wall thickening or inflammatory changes seen about the bowels. Scattered colonic diverticular present without acute diverticulitis. No evidence for appendicitis.  Bladder not well evaluated due to extensive streak artifact from bilateral total hip arthroplasties. Similarly, pelvic viscera are not well seen.  No free air or fluid. No adenopathy. Moderate aorto bi-iliac atherosclerotic calcifications present. No aneurysm.  No acute osseous abnormality. No worrisome lytic or blastic osseous lesions.  IMPRESSION: 1. Mild left perinephric and periureteral fat stranding, suggestive of possible urinary tract infection. Correlation with urinalysis recommended. No hydronephrosis. 2. No other acute intra-abdominal or pelvic process identified. 3. Colonic diverticulosis without acute diverticulitis. 4. Bilateral hip arthroplasties in place. 5. Moderate aorto  bi-iliac atherosclerotic disease. No intra-abdominal aneurysm. 6. Small hiatal hernia.   Electronically Signed   By: Rise Mu M.D.   On: 06/08/2014 05:02   Dg Chest Port 1 View  06/09/2014   CLINICAL DATA:  Check line placement  EXAM: PORTABLE CHEST - 1 VIEW  COMPARISON:  06/08/2014  FINDINGS: Cardiac shadow is stable. A PICC line is now noted on the right. The catheter tip is noted in the mid to distal superior vena cava just above the cavoatrial junction in satisfactory position. No focal infiltrate is seen. No acute bony abnormality is noted.  IMPRESSION: Status post PICC line placement on the right in satisfactory position. No new focal abnormality is seen.   Electronically Signed   By: Alcide Clever M.D.   On: 06/09/2014 11:25   Dg Chest Port 1 View  06/08/2014   CLINICAL DATA:  Shortness of breath  EXAM: PORTABLE CHEST - 1 VIEW  COMPARISON:  06/08/2014 221 hr  FINDINGS: The heart size and mediastinal contours are within normal limits. Both lungs are clear. The visualized skeletal structures are unremarkable.  IMPRESSION: No active disease.   Electronically Signed   By: Alcide Clever M.D.   On: 06/08/2014 17:11  Dg Chest Portable 1 View  06/08/2014   CLINICAL DATA:  SHORTNESS OF BREATH SHORTNESS OF BREATH  EXAM: PORTABLE CHEST - 1 VIEW  COMPARISON:  None available  FINDINGS: Transverse heart size is at the upper limits of normal. Mediastinal silhouette within normal limits. Atherosclerotic calcifications present within the aortic arch.  The lungs are normally inflated. No airspace consolidation, pleural effusion, or pulmonary edema is identified. There is no pneumothorax.  No acute osseous abnormality identified.  IMPRESSION: No active cardiopulmonary disease.   Electronically Signed   By: Rise Mu M.D.   On: 06/08/2014 03:00    Scheduled Meds: . apixaban  2.5 mg Oral BID  . carvedilol  3.125 mg Oral BID WC  . cefTRIAXone (ROCEPHIN)  IV  1 g Intravenous Q24H  . lisinopril   10 mg Oral Daily  . sodium chloride  10-40 mL Intracatheter Q12H   Continuous Infusions: . sodium chloride 100 mL/hr at 06/08/14 0751  . amiodarone 30 mg/hr (06/08/14 2259)    Principal Problem:   Sepsis Active Problems:   Pyelonephritis   Atrial fibrillation with RVR   Dementia    Time spent: 35 min    Jeralyn Bennett  [facility] Pager [phone]. If 7PM-7AM, please contact night-coverage at [email or web address], password [facility] 06/09/2014, 3:03 PM  LOS: 1 day

## 2014-06-09 NOTE — Progress Notes (Signed)
  Echocardiogram 2D Echocardiogram has been performed.  Cathie Beams 06/09/2014, 9:13 AM

## 2014-06-09 NOTE — Progress Notes (Signed)
On unit to place PICC.  Pt using BSC.  Echo tech in room currently- states should be done in 45 minutes.  Will return later.

## 2014-06-09 NOTE — Progress Notes (Addendum)
Spoke with Legrand Como regarding PICC placement.  Remains on Amiodarone gtt.  States pt pleasantly confused.  States pt to be ready in approximately 30 minutes.

## 2014-06-09 NOTE — Progress Notes (Signed)
Peripherally Inserted Central Catheter/Midline Placement  The IV Nurse has discussed with the patient and/or persons authorized to consent for the patient, the purpose of this procedure and the potential benefits and risks involved with this procedure.  The benefits include less needle sticks, lab draws from the catheter and patient may be discharged home with the catheter.  Risks include, but not limited to, infection, bleeding, blood clot (thrombus formation), and puncture of an artery; nerve damage and irregular heat beat.  Alternatives to this procedure were also discussed. Consent obtained by floor staff from family due to pt confusion.  PICC/Midline Placement Documentation  PICC / Midline Double Lumen 06/09/14 PICC Right Basilic 36 cm 0 cm (Active)  Indication for Insertion or Continuance of Line Vasoactive infusions;Limited venous access - need for IV therapy >5 days (PICC only);Poor Vasculature-patient has had multiple peripheral attempts or PIVs lasting less than 24 hours 06/09/2014 10:28 AM  Exposed Catheter (cm) 0 cm 06/09/2014 10:28 AM  Site Assessment Clean;Dry;Intact 06/09/2014 10:28 AM  Lumen #1 Status Flushed;Saline locked;Blood return noted 06/09/2014 10:28 AM  Lumen #2 Status Flushed;Saline locked;Blood return noted 06/09/2014 10:28 AM  Dressing Type Transparent 06/09/2014 10:28 AM  Dressing Status Clean;Dry;Intact;Antimicrobial disc in place 06/09/2014 10:28 AM  Line Care Connections checked and tightened 06/09/2014 10:28 AM  Line Adjustment (NICU/IV Team Only) No 06/09/2014 10:28 AM  Dressing Intervention New dressing 06/09/2014 10:28 AM  Dressing Change Due 06/16/14 06/09/2014 10:28 AM       Elliot Dally 06/09/2014, 10:29 AM

## 2014-06-09 NOTE — Progress Notes (Signed)
Patient Name: Yvonne Gates Date of Encounter: 06/09/2014  Principal Problem:   Sepsis Active Problems:   Pyelonephritis   Atrial fibrillation with RVR   Dementia   Length of Stay: 1  SUBJECTIVE  She feels better today. SOB has improved, no chest pain.   CURRENT MEDS . apixaban  2.5 mg Oral BID  . cefTRIAXone (ROCEPHIN)  IV  1 g Intravenous Q24H  . lisinopril  10 mg Oral Daily   . sodium chloride 100 mL/hr at 06/08/14 0751  . amiodarone 30 mg/hr (06/08/14 2259)    OBJECTIVE  Filed Vitals:   06/08/14 1508 06/08/14 1625 06/08/14 2038 06/09/14 0545  BP: 126/60 128/68 158/90 148/97  Pulse: 81 91 75 102  Temp: 98.7 F (37.1 C)  98.6 F (37 C) 98 F (36.7 C)  TempSrc: Oral  Oral Oral  Resp: Height:      Weight:      SpO2: 96% 98% 93% 95%    Intake/Output Summary (Last 24 hours) at 06/09/14 1007 Last data filed at 06/09/14 0700  Gross per 24 hour  Intake    303 ml  Output    200 ml  Net    103 ml   Filed Weights   06/08/14 0624 06/08/14 0705  Weight: 145 lb (65.772 kg) 145 lb 6.4 oz (65.953 kg)    PHYSICAL EXAM  General: Pleasant, NAD. Neuro: Alert and oriented X 3. Moves all extremities spontaneously. Psych: Normal affect. HEENT:  Normal  Neck: Supple without bruits or JVD. Lungs:  Resp regular and unlabored, CTA. Heart: RRR no s3, s4, or murmurs. Abdomen: Soft, non-tender, non-distended, BS + x 4.  Extremities: No clubbing, cyanosis or edema. DP/PT/Radials 2+ and equal bilaterally.  Accessory Clinical Findings  CBC  Recent Labs  06/08/14 0221 06/09/14 0530  WBC 12.5* 10.6*  NEUTROABS 9.7*  --   HGB 13.0 12.0  HCT 40.6 38.3  MCV 94.9 93.4  PLT 213 215   Basic Metabolic Panel  Recent Labs  06/08/14 0221 06/09/14 0530  NA 140 139  K 4.0 3.5*  CL 100 101  CO2 24 23  GLUCOSE 128* 136*  BUN 18 9  CREATININE 0.80 0.65  CALCIUM 10.3 9.1   Liver Function Tests  Recent Labs  06/08/14 0356  AST 20  ALT 12  ALKPHOS 92    BILITOT 0.9  PROT 7.8  ALBUMIN 3.9   No results found for this basename: LIPASE, AMYLASE,  in the last 72 hours Cardiac Enzymes  Recent Labs  06/08/14 0221  TROPONINI <0.30   Radiology/Studies  Ct Abdomen Pelvis Wo Contrast  06/08/2014   CLINICAL DATA:  Fever, abdominal pain  IMPRESSION: 1. Mild left perinephric and periureteral fat stranding, suggestive of possible urinary tract infection. Correlation with urinalysis recommended. No hydronephrosis. 2. No other acute intra-abdominal or pelvic process identified. 3. Colonic diverticulosis without acute diverticulitis. 4. Bilateral hip arthroplasties in place. 5. Moderate aorto bi-iliac atherosclerotic disease. No intra-abdominal aneurysm. 6. Small hiatal hernia.   Electronically Signed   By: Rise Mu M.D.   On: 06/08/2014 05:02   Dg Chest Port 1 View  06/08/2014   CLINICAL DATA:  Shortness of breath  EXAM: PORTABLE CHEST - 1 VIEW  COMPARISON:  06/08/2014 221 hr  FINDINGS: The heart size and mediastinal contours are within normal limits. Both lungs are clear. The visualized skeletal structures are unremarkable.  IMPRESSION: No active disease.   Electronically Signed   By: Loraine Leriche  Lukens M.D.   On: 06/08/2014 17:11   Dg Chest Portable 1 View  06/08/2014   CLINICAL DATA:  SHORTNESS OF BREATH SHORTNESS OF BREATH  IMPRESSION: No active cardiopulmonary disease.     TELE    ASSESSMENT AND PLAN  Principal Problem:  Sepsis  Active Problems:  Pyelonephritis  Atrial fibrillation with RVR  Dementia   Yvonne Gates is a 80 y.o. ALF resident female with PMH of dementia, paroxysmal Afib, HTN, and tremors who is visiting from [place], [place] for granddaughter's wedding who presented to [facility] today with fevers, chills, abdominal pain and palpitations and found to have sepsis due to pyelonephritis and afib with RVR.   1.Sepsis  -- Due to Pyelonephritis and improving with IV Abx   2. Atrial fibrillation with RVR - on Amiodarone drip,  slowly decreasing HR 130--> 115 BPM, this is expected to improve once her sepsis is resolved. Patient feeling much better.  -- Was on Tikosyn, stopped last week per Cards in [place] likely due to prolonged QT. QTc 515 on ECG here.  -- On apixaban 2.5 mg PO BID due to her age and weight.  -- 2D ECHO is pending  3. Long QT- QTc 515 on ECG here. Tikosyn stopped appropriately. Will continue amiodarone for afib. Repeat ECG  4. Dementia  -- Stable.   5. HTN uncontrolled - will add low doe carvedilol 3.125 mg po BID    Signed, Lars Masson MD, [facility] 06/09/2014

## 2014-06-10 LAB — CBC
HCT: 33.9 % — ABNORMAL LOW (ref 36.0–46.0)
HEMOGLOBIN: 10.7 g/dL — AB (ref 12.0–15.0)
MCH: 30.2 pg (ref 26.0–34.0)
MCHC: 31.6 g/dL (ref 30.0–36.0)
MCV: 95.8 fL (ref 78.0–100.0)
Platelets: 169 10*3/uL (ref 150–400)
RBC: 3.54 MIL/uL — AB (ref 3.87–5.11)
RDW: 15.2 % (ref 11.5–15.5)
WBC: 8.2 10*3/uL (ref 4.0–10.5)

## 2014-06-10 LAB — URINE CULTURE
Colony Count: NO GROWTH
Culture: NO GROWTH

## 2014-06-10 LAB — BASIC METABOLIC PANEL
ANION GAP: 12 (ref 5–15)
BUN: 21 mg/dL (ref 6–23)
CHLORIDE: 103 meq/L (ref 96–112)
CO2: 26 mEq/L (ref 19–32)
CREATININE: 1.11 mg/dL — AB (ref 0.50–1.10)
Calcium: 8.3 mg/dL — ABNORMAL LOW (ref 8.4–10.5)
GFR calc Af Amer: 50 mL/min — ABNORMAL LOW (ref >90)
GFR calc non Af Amer: 43 mL/min — ABNORMAL LOW (ref >90)
Glucose, Bld: 101 mg/dL — ABNORMAL HIGH (ref 70–99)
POTASSIUM: 3.9 meq/L (ref 3.7–5.3)
Sodium: 141 mEq/L (ref 137–147)

## 2014-06-10 MED ORDER — SODIUM CHLORIDE 0.9 % IV SOLN
INTRAVENOUS | Status: DC
  Administered 2014-06-10: 75 mL via INTRAVENOUS
  Administered 2014-06-11: 950 mL via INTRAVENOUS

## 2014-06-10 MED ORDER — FUROSEMIDE 10 MG/ML IJ SOLN
40.0000 mg | Freq: Once | INTRAMUSCULAR | Status: AC
  Administered 2014-06-10: 40 mg via INTRAVENOUS

## 2014-06-10 MED ORDER — FUROSEMIDE 10 MG/ML IJ SOLN
INTRAMUSCULAR | Status: AC
  Filled 2014-06-10: qty 4

## 2014-06-10 NOTE — Progress Notes (Addendum)
Patient Name: Yvonne Gates Date of Encounter: 06/10/2014  Principal Problem:   Sepsis Active Problems:   Pyelonephritis   Atrial fibrillation with RVR   Dementia   Length of Stay: 2  SUBJECTIVE  No complains, SOB has improved.  CURRENT MEDS . amiodarone  400 mg Oral BID  . apixaban  2.5 mg Oral BID  . carvedilol  3.125 mg Oral BID WC  . cefTRIAXone (ROCEPHIN)  IV  1 g Intravenous Q24H  . lisinopril  10 mg Oral Daily  . sodium chloride  10-40 mL Intracatheter Q12H   . sodium chloride     OBJECTIVE  Filed Vitals:   06/09/14 1700 06/09/14 2012 06/09/14 2210 06/10/14 0528  BP: 100/60 117/75  149/98  Pulse: 66 77 100 95  Temp:  99.2 F (37.3 C)  98.2 F (36.8 C)  TempSrc:  Oral    Resp:  18  16  Height:      Weight:      SpO2:  97%  96%    Intake/Output Summary (Last 24 hours) at 06/10/14 0925 Last data filed at 06/10/14 0600  Gross per 24 hour  Intake     60 ml  Output    200 ml  Net   -140 ml   Filed Weights   06/08/14 0624 06/08/14 0705  Weight: 145 lb (65.772 kg) 145 lb 6.4 oz (65.953 kg)   PHYSICAL EXAM  General: Pleasant, NAD. Neuro: Alert and oriented X 3. Moves all extremities spontaneously. Psych: Normal affect. HEENT:  Normal  Neck: Supple without bruits or JVD. Lungs:  Resp regular and unlabored, CTA. Heart: RRR no s3, s4, or murmurs. Abdomen: Soft, non-tender, non-distended, BS + x 4.  Extremities: No clubbing, cyanosis or edema. DP/PT/Radials 2+ and equal bilaterally.  Accessory Clinical Findings  CBC  Recent Labs  06/08/14 0221 06/09/14 0530 06/10/14 0435  WBC 12.5* 10.6* 8.2  NEUTROABS 9.7*  --   --   HGB 13.0 12.0 10.7*  HCT 40.6 38.3 33.9*  MCV 94.9 93.4 95.8  PLT 213 215 169   Basic Metabolic Panel  Recent Labs  06/09/14 0530 06/10/14 0435  NA 139 141  K 3.5* 3.9  CL 101 103  CO2 23 26  GLUCOSE 136* 101*  BUN 9 21  CREATININE 0.65 1.11*  CALCIUM 9.1 8.3*   Liver Function Tests  Recent Labs   06/08/14 0356  AST 20  ALT 12  ALKPHOS 92  BILITOT 0.9  PROT 7.8  ALBUMIN 3.9   No results found for this basename: LIPASE, AMYLASE,  in the last 72 hours Cardiac Enzymes  Recent Labs  06/08/14 0221  TROPONINI <0.30   Radiology/Studies  Ct Abdomen Pelvis Wo Contrast  06/08/2014   CLINICAL DATA:  Fever, abdominal pain  IMPRESSION: 1. Mild left perinephric and periureteral fat stranding, suggestive of possible urinary tract infection. Correlation with urinalysis recommended. No hydronephrosis. 2. No other acute intra-abdominal or pelvic process identified. 3. Colonic diverticulosis without acute diverticulitis. 4. Bilateral hip arthroplasties in place. 5. Moderate aorto bi-iliac atherosclerotic disease. No intra-abdominal aneurysm. 6. Small hiatal hernia.   Electronically Signed   By: Rise Mu M.D.   On: 06/08/2014 05:02   Dg Chest Port 1 View  06/08/2014   CLINICAL DATA:  Shortness of breath  EXAM: PORTABLE CHEST - 1 VIEW  COMPARISON:  06/08/2014 221 hr  FINDINGS: The heart size and mediastinal contours are within normal limits. Both lungs are clear. The visualized skeletal structures are unremarkable.  IMPRESSION: No active disease.   Electronically Signed   By: Alcide Clever M.D.   On: 06/08/2014 17:11   Dg Chest Portable 1 View  06/08/2014   CLINICAL DATA:  SHORTNESS OF BREATH SHORTNESS OF BREATH  IMPRESSION: No active cardiopulmonary disease.     TELE: a-fib, HR in 90'  Echo 06/09/2014 Left ventricle: The cavity size was normal. Systolic function was normal. The estimated ejection fraction was in the range of 55% to 60%. Wall motion was normal; there were no regional wall motion abnormalities. - Aortic valve: Mildly calcified annulus. Trileaflet; normal thickness leaflets. - Mitral valve: Calcified annulus. There was mild regurgitation. - Left atrium: The atrium was severely dilated. - Tricuspid valve: There was trivial regurgitation.    ASSESSMENT AND  PLAN  Principal Problem:  Sepsis  Active Problems:  Pyelonephritis  Atrial fibrillation with RVR  Dementia   Yvonne Gates is a 80 y.o. ALF resident female with PMH of dementia, paroxysmal Afib, HTN, and tremors who is visiting from [place], [place] for granddaughter's wedding who presented to [facility] today with fevers, chills, abdominal pain and palpitations and found to have sepsis due to pyelonephritis and afib with RVR.   1.Sepsis  -- Due to Pyelonephritis and improving with IV Abx   2. Atrial fibrillation with RVR - Amiodarone drip was switched to PO 400 mg BID, she is now rate controlled The paln is to continue  Amiodarone 400 mg PO BID x 3 days Amiodarone 400 mg PO QD x 4 days Amiodarone 200 mg PO QD chronically  -- Was on Tikosyn, stopped last week per Cards in [place] likely due to prolonged QT. QTc 515 on ECG here.  -- On apixaban 2.5 mg PO BID due to her age and weight.  -- LVEF normal, severely dilated left atrium and no significant valvular abnormality  3. Long QT- QTc 515 on ECG here. Tikosyn stopped appropriately. Will continue amiodarone for afib. Repeat ECG  4. Dementia  -- Stable.   5. HTN controlled after adding carvedilol 3.125 mg po BID    Signed, Lars Masson MD, [facility] 06/10/2014

## 2014-06-10 NOTE — Progress Notes (Signed)
[facility] PROGRESS NOTE  Yvonne Gates ZOX:[id number] DOB: [birth year] DOA: 06/08/2014 PCP: Pcp Not In System  Assessment/Plan: 1. Atrial fibrillation with rapid ventricular response. -Patient patient presenting with ventricular rates in the 140s. -She had previously been on anti-consent which was discontinued about a week ago by her cardiologist in [facility] given prolonged QT. -She was started on amiodarone drip with improvement in heart rates -I suspect underlying infectious process contributing to development of A. fib with RVR -Patient transitioned to PO amiodarone 400 mg BID -Will follow regimen recommended by Dr Delton See: Amiodarone 400 mg PO BID x 3 days  Amiodarone 400 mg PO QD x 4 days  Amiodarone 200 mg PO QD chronically  2.  Sepsis -Present on admission, evidenced by a white count of 12,500, heart rate of 150, respiratory rate of 27 -Infectious source likely to be a urinary tract infection -Continue empiric IV antimicrobial therapy with ceftriaxone 1 g IV every 24 hours  3. Urinary tract infection -Urinalysis showing the presence of many bacteria, nitrates and leukocyte Estrace. -Pending urine cultures -Meanwhile will continue Rocephin 1 g IV every 12 hours as she appears to be showing clinical improvement.  4. Hypertension -Stable  Code Status: DO NOT RESUSCITATE Family Communication: Spoke with patient's daughter Disposition Plan: Anticipate discharge in 24 hours   Consultants:  Cardiology  Antibiotics:  Ceftriaxone gram IV every 24 hours  HPI/Subjective: Patient is a pleasant 80 year old female with a history of chronic atrial fibrillation, admitted to the medicine service on 06/08/2014. Presenting with Afib with RVR.   Objective: Filed Vitals:   06/10/14 1003  BP: 122/63  Pulse: 120  Temp:   Resp:     Intake/Output Summary (Last 24 hours) at 06/10/14 1258 Last data filed at 06/10/14 0600  Gross per 24 hour  Intake     60 ml   Output    200 ml  Net   -140 ml   Filed Weights   06/08/14 0624 06/08/14 0705  Weight: 65.772 kg (145 lb) 65.953 kg (145 lb 6.4 oz)    Exam:   General:  Patient is in no acute distress, she is sitting at bedside, tolerating by mouth intake. She is awake alert, pleasant, ambulated down the hallway  Cardiovascular: Irregular rate and rhythm, tachycardic normal S1-S2, no edema to extremities  Respiratory: Patient having normal respiratory effort, lungs overall clear to auscultation bilaterally  Abdomen: Soft nontender nondistended  Musculoskeletal: No edema  Data Reviewed: Basic Metabolic Panel:  Recent Labs Lab 06/08/14 0221 06/09/14 0530 06/10/14 0435  NA 140 139 141  K 4.0 3.5* 3.9  CL 100 101 103  CO2 GLUCOSE 128* 136* 101*  BUN CREATININE 0.80 0.65 1.11*  CALCIUM 10.3 9.1 8.3*   Liver Function Tests:  Recent Labs Lab 06/08/14 0356  AST 20  ALT 12  ALKPHOS 92  BILITOT 0.9  PROT 7.8  ALBUMIN 3.9   No results found for this basename: LIPASE, AMYLASE,  in the last 168 hours No results found for this basename: AMMONIA,  in the last 168 hours CBC:  Recent Labs Lab 06/08/14 0221 06/09/14 0530 06/10/14 0435  WBC 12.5* 10.6* 8.2  NEUTROABS 9.7*  --   --   HGB 13.0 12.0 10.7*  HCT 40.6 38.3 33.9*  MCV 94.9 93.4 95.8  PLT 213 215 169   Cardiac Enzymes:  Recent Labs Lab 06/08/14 0221  TROPONINI <0.30   BNP (last 3 results) No results found  for this basename: PROBNP,  in the last 8760 hours CBG: No results found for this basename: GLUCAP,  in the last 168 hours  Recent Results (from the past 240 hour(s))  URINE CULTURE     Status: None   Collection Time    06/08/14  2:31 AM      Result Value Ref Range Status   Specimen Description URINE, CATHETERIZED   Final   Special Requests NONE   Final   Culture  Setup Time     Final   Value: 06/08/2014 08:54     Performed at [facility] Count     Final   Value:  >=100,000 COLONIES/ML     Performed at [facility]   Culture     Final   Value: ESCHERICHIA COLI     Performed at [facility]   Report Status 06/10/2014 FINAL   Final   Organism ID, Bacteria ESCHERICHIA COLI   Final     Studies: Dg Chest Port 1 View  06/09/2014   CLINICAL DATA:  Check line placement  EXAM: PORTABLE CHEST - 1 VIEW  COMPARISON:  06/08/2014  FINDINGS: Cardiac shadow is stable. A PICC line is now noted on the right. The catheter tip is noted in the mid to distal superior vena cava just above the cavoatrial junction in satisfactory position. No focal infiltrate is seen. No acute bony abnormality is noted.  IMPRESSION: Status post PICC line placement on the right in satisfactory position. No new focal abnormality is seen.   Electronically Signed   By: Alcide Clever M.D.   On: 06/09/2014 11:25   Dg Chest Port 1 View  06/08/2014   CLINICAL DATA:  Shortness of breath  EXAM: PORTABLE CHEST - 1 VIEW  COMPARISON:  06/08/2014 221 hr  FINDINGS: The heart size and mediastinal contours are within normal limits. Both lungs are clear. The visualized skeletal structures are unremarkable.  IMPRESSION: No active disease.   Electronically Signed   By: Alcide Clever M.D.   On: 06/08/2014 17:11    Scheduled Meds: . amiodarone  400 mg Oral BID  . apixaban  2.5 mg Oral BID  . carvedilol  3.125 mg Oral BID WC  . cefTRIAXone (ROCEPHIN)  IV  1 g Intravenous Q24H  . lisinopril  10 mg Oral Daily  . sodium chloride  10-40 mL Intracatheter Q12H   Continuous Infusions: . sodium chloride 75 mL (06/10/14 1005)    Principal Problem:   Sepsis Active Problems:   Pyelonephritis   Atrial fibrillation with RVR   Dementia    Time spent: 35 min    Jeralyn Bennett  [facility] Pager [phone]. If 7PM-7AM, please contact night-coverage at [email or web address], password [facility] 06/10/2014, 12:58 PM  LOS: 2 days

## 2014-06-10 NOTE — Progress Notes (Signed)
Patient received PRN treatment from the RN.

## 2014-06-11 MED ORDER — CARVEDILOL 3.125 MG PO TABS: 3.1250 mg | ORAL_TABLET | Freq: Two times a day (BID) | ORAL | Status: AC

## 2014-06-11 MED ORDER — AMIODARONE HCL 400 MG PO TABS: ORAL_TABLET | ORAL | Status: AC

## 2014-06-11 MED ORDER — CEFUROXIME AXETIL 500 MG PO TABS: 500.0000 mg | ORAL_TABLET | Freq: Two times a day (BID) | ORAL | Status: AC

## 2014-06-11 MED ORDER — CARVEDILOL 6.25 MG PO TABS
6.2500 mg | ORAL_TABLET | Freq: Two times a day (BID) | ORAL | Status: DC
  Filled 2014-06-11 (×2): qty 1

## 2014-06-11 NOTE — Progress Notes (Signed)
Subjective: Pleasant.  Confused.  Objective: Vital signs in last 24 hours: Temp:  [97.5 F (36.4 C)-97.8 F (36.6 C)] 97.8 F (36.6 C) (08/31 0545) Pulse Rate:  [88-120] 107 (08/31 0545) Resp:  [18-22] 20 (08/31 0545) BP: (122-167)/(63-98) 141/97 mmHg (08/31 0545) SpO2:  [98 %-100 %] 98 % (08/31 0545) Weight:  [143 lb (64.864 kg)] 143 lb (64.864 kg) (08/31 0545) Last BM Date: 06/07/14  Intake/Output from previous day: 08/30 0701 - 08/31 0700 In: 100 [P.O.:100] Out: 500 [Urine:500] Intake/Output this shift:    Medications Current Facility-Administered Medications  Medication Dose Route Frequency Provider Last Rate Last Dose  . 0.9 %  sodium chloride infusion   Intravenous Continuous Roma Kayser Schorr, NP 20 mL/hr at 06/11/14 0443 950 mL at 06/11/14 0443  . acetaminophen (TYLENOL) tablet 650 mg  650 mg Oral Q6H PRN Zannie Cove, MD      . amiodarone (PACERONE) tablet 400 mg  400 mg Oral BID Jeralyn Bennett, MD   400 mg at 06/10/14 2213  . apixaban (ELIQUIS) tablet 2.5 mg  2.5 mg Oral BID Janetta Hora, PA-C   2.5 mg at 06/10/14 2212  . carvedilol (COREG) tablet 3.125 mg  3.125 mg Oral BID WC Lars Masson, MD   3.125 mg at 06/10/14 1635  . cefTRIAXone (ROCEPHIN) 1 g in dextrose 5 % 50 mL IVPB  1 g Intravenous Q24H Zannie Cove, MD   1 g at 06/11/14 0531  . levalbuterol (XOPENEX) nebulizer solution 1.25 mg  1.25 mg Nebulization Q6H PRN Richarda Overlie, MD   1.25 mg at 06/10/14 2148  . lisinopril (PRINIVIL,ZESTRIL) tablet 10 mg  10 mg Oral Daily Zannie Cove, MD   10 mg at 06/10/14 1006  . ondansetron (ZOFRAN) injection 4 mg  4 mg Intravenous Q6H PRN Zannie Cove, MD   4 mg at 06/10/14 2057  . sodium chloride 0.9 % injection 10-40 mL  10-40 mL Intracatheter Q12H Zannie Cove, MD      . sodium chloride 0.9 % injection 10-40 mL  10-40 mL Intracatheter PRN Zannie Cove, MD   10 mL at 06/10/14 2049    PE: General appearance: alert, cooperative and no  distress Lungs: clear to auscultation bilaterally Heart: irregularly irregular rhythm Abdomen: +BS, nontender Extremities: No LEE. Pulses: 2+ and symmetric Skin: Warm and dry Neurologic: Grossly normal, Not oriented.   Lab Results:   Recent Labs  06/09/14 0530 06/10/14 0435  WBC 10.6* 8.2  HGB 12.0 10.7*  HCT 38.3 33.9*  PLT 215 169   BMET  Recent Labs  06/09/14 0530 06/10/14 0435  NA 139 141  K 3.5* 3.9  CL 101 103  CO2 23 26  GLUCOSE 136* 101*  BUN 9 21  CREATININE 0.65 1.11*  CALCIUM 9.1 8.3*    Assessment/Plan  Yvonne Gates is a 80 y.o. ALF resident female with PMH of dementia, paroxysmal Afib, HTN, and tremors who is visiting from [place], [place] for granddaughter's wedding who presented to [facility] today with fevers, chills, abdominal pain and palpitations and found to have sepsis due to pyelonephritis and afib with RVR.   1.Sepsis  Due to Pyelonephritis and improving with IV Abx  2. Atrial fibrillation with RVR - Amiodarone drip was switched to PO 400 mg BID,  rate stable and controlled(90-105)  The plan is to continue  Amiodarone 400 mg PO BID x 3 days  Amiodarone 400 mg PO QD x 4 days  Amiodarone 200 mg PO QD chronically  Was  on Tikosyn, stopped last week per Cards in [place] likely due to prolonged QT. QTc 515 on ECG here.  On apixaban 2.5 mg PO BID due to her age and weight.  LVEF normal, severely dilated left atrium and no significant valvular abnormality  3. Long QT- QTc 515 on ECG here. Tikosyn stopped appropriately. Will continue amiodarone for afib. Repeat ECG  4. Dementia  Stable.  5. HTN  Mildly elevated.  On lisinopril 10 and carvedilol 3.125 mg po BID.      LOS: 3 days    HAGER, BRYAN PA-C 06/11/2014 7:30 AM  As above, patient seen and examined. She is confused this morning. She remains in atrial fibrillation. Rate elevated; increase coreg to 6.25 BID. Continue amiodarone as outlined above. Continue apixaban for now. If she does not  convert then would plan to proceed with cardioversion in approximately 4 weeks when she returns to [place]. Once sinus is reestablished long-term anticoagulation should be revisited. She is very confused this morning and her risk may outweigh the benefit. However her dementia may be worse in the hospital and this could be assessed following discharge. Atrial fibrillation most likely related to urosepsis on admission. Olga Millers

## 2014-06-11 NOTE — Progress Notes (Signed)
Pt c/o being SOB. Sat 93% on 2 L [place]. Pt with audible wheezes and diminished in the bases and using accessory muscle to breathe. Pt given Xopenex neb treatment without much improvement. Respiratory asked to assess and K. Schorr, NP made aware of SOB and of increase in IVF today. Orders placed and pt medicated. Will cont to monitor closely.

## 2014-06-11 NOTE — Discharge Summary (Signed)
Physician Discharge Summary  Dina Warbington ZOX:[id number] DOB: Mar 14, 1924 DOA: 06/08/2014  PCP: Pcp Not In System  Admit date: 06/08/2014 Discharge date: 06/11/2014  Time spent:  minutes  Recommendations for Outpatient Follow-up:  1. Please follow up on heart rates, was admitted with Afib w RVR, discharged on Amiodarone. Her Tikosyn was recently discontinued by her cardiologist given prolonged QT.   2. CBC and BMP in 1 week  Discharge Diagnoses:  Principal Problem:   Sepsis Active Problems:   Pyelonephritis   Atrial fibrillation with RVR   Dementia   Discharge Condition: Stable/Improved  Diet recommendation: Heart Healthy  Filed Weights   06/08/14 0624 06/08/14 0705 06/11/14 0545  Weight: 65.772 kg (145 lb) 65.953 kg (145 lb 6.4 oz) 64.864 kg (143 lb)    History of present illness:  Yvonne Gates is a 80 y.o. female with PMH of Dementia, H/o Afib, HTN, tremors resident of ALF is visiting [place] for a wedding from [place] with her daughter.  She was doing welll during the day yetserday and last night started experiencing chills, L sided abd pain, frequent urination and some wheezing.  -Her daughter brought her to the ER and was found to be febrile with temp of 101, Afib with RVR, leukocytosis and UTI with pyelonephritis on CT.  Off note, she saw her cardiologist last week in [place] and her Tikosyn was stopped and switched from Coumadin to Apixaban   Hospital Course:  1. Atrial fibrillation with rapid ventricular response. -Patient patient presenting with ventricular rates in the 140s.  -She had previously been on anti-consent which was discontinued about a week ago by her cardiologist in [facility] given prolonged QT.  -She was started on amiodarone drip with improvement in heart rates  -I suspect underlying infectious process contributing to development of A. fib with RVR as well  -Patient transitioned to PO amiodarone 400 mg BID  -Will follow regimen  recommended by Dr Delton See:  Amiodarone 400 mg PO BID x 3 days  Amiodarone 400 mg PO QD x 4 days  Amiodarone 200 mg PO QD chronically  -Will need to follow up with Cardiology in 1 week  2. Sepsis  -Present on admission, evidenced by a white count of 12,500, heart rate of 150, respiratory rate of 27  -Infectious source likely to be a urinary tract infection  -Treated with empiric IV antimicrobial therapy with ceftriaxone 1 g IV every 24 hours  -Urine cultures did not show growth -Will discharge on Ceftin for 4 more days  3. Urinary tract infection  -Urinalysis showing the presence of many bacteria, nitrates and leukocyte Estrace.  -Urine cultures showing no growth -Discharge on Ceftin  4. Hypertension  -Coreg added during this hospitalization   Procedures:  2D Echo performed on 06/09/2014 IMPRESSION: Left ventricle: The cavity size was normal. Systolic function was normal. The estimated ejection fraction was in the range of 55% to 60%. Wall motion was normal; there were no regional wall motion abnormalities.  Consultations:  Cardiology  Discharge Exam: Filed Vitals:   06/11/14 0843  BP: 148/100  Pulse: 101  Temp:   Resp:     General: Patient having no complaints, she is awake and alert, following commands Cardiovascular: Regular rate and rhythm normal S1S2 Respiratory: Clear to auscultation bilaterally, normal inspiratory effort Abdomen: Soft, nontender nondistended  Discharge Instructions You were cared for by a hospitalist during your hospital stay. If you have any questions about your discharge medications or the care you received while you were  in the hospital after you are discharged, you can call the unit and asked to speak with the hospitalist on call if the hospitalist that took care of you is not available. Once you are discharged, your primary care physician will handle any further medical issues. Please note that NO REFILLS for any discharge medications will  be authorized once you are discharged, as it is imperative that you return to your primary care physician (or establish a relationship with a primary care physician if you do not have one) for your aftercare needs so that they can reassess your need for medications and monitor your lab values.  Discharge Instructions   Call MD for:  difficulty breathing, headache or visual disturbances    Complete by:  As directed      Call MD for:  hives    Complete by:  As directed      Call MD for:  persistant dizziness or light-headedness    Complete by:  As directed      Call MD for:  persistant nausea and vomiting    Complete by:  As directed      Call MD for:  redness, tenderness, or signs of infection (pain, swelling, redness, odor or green/yellow discharge around incision site)    Complete by:  As directed      Call MD for:  severe uncontrolled pain    Complete by:  As directed      Call MD for:  temperature >100.4    Complete by:  As directed      Diet - low sodium heart healthy    Complete by:  As directed      Discharge instructions    Complete by:  As directed   Please follow up with your cardiologist within a week     Increase activity slowly    Complete by:  As directed             Medication List         amiodarone 400 MG tablet  Commonly known as:  PACERONE  Take 400 mg PO BID for 3 days, followed by 400 mg PO q daily for 4 days then 200 mg PO q daily     apixaban 5 MG Tabs tablet  Commonly known as:  ELIQUIS  Take 5 mg by mouth 2 (two) times daily.     calcium citrate-vitamin D 315-200 MG-UNIT per tablet  Commonly known as:  CITRACAL+D  Take 1 tablet by mouth 2 (two) times daily.     carvedilol 3.125 MG tablet  Commonly known as:  COREG  Take 1 tablet (3.125 mg total) by mouth 2 (two) times daily with a meal.     cefUROXime 500 MG tablet  Commonly known as:  CEFTIN  Take 1 tablet (500 mg total) by mouth 2 (two) times daily with a meal.     fluticasone 50 MCG/ACT  nasal spray  Commonly known as:  FLONASE  Place 1 spray into both nostrils daily.     lisinopril 10 MG tablet  Commonly known as:  PRINIVIL,ZESTRIL  Take 10 mg by mouth daily.       Allergies  Allergen Reactions  . Advil [Ibuprofen] Other (See Comments)    Unknown;  . Codeine Other (See Comments)    unknown  . Diovan [Valsartan] Other (See Comments)    unknown  . Iodine Other (See Comments)    unknown  . Morphine And Related Other (See Comments)  unknown      The results of significant diagnostics from this hospitalization (including imaging, microbiology, ancillary and laboratory) are listed below for reference.    Significant Diagnostic Studies: Ct Abdomen Pelvis Wo Contrast  06/08/2014   CLINICAL DATA:  Fever, abdominal pain  EXAM: CT ABDOMEN AND PELVIS WITHOUT CONTRAST  TECHNIQUE: Multidetector CT imaging of the abdomen and pelvis was performed following the standard protocol without IV contrast.  COMPARISON:  None.  FINDINGS: Subsegmental atelectasis seen dependently within the visualized lung bases. Mild cardiomegaly partially visualized. No pleural or pericardial effusion.  Limited noncontrast evaluation of the liver is unremarkable. Gallbladder within normal limits. No biliary dilatation. The spleen, adrenal glands, and pancreas demonstrate a normal unenhanced appearance.  Right kidney within normal limits without evidence of nephrolithiasis or hydronephrosis.  The left kidney is mildly enlarged as compared to the contralateral right with subtle perinephric fat stranding. Mild periureteral stranding seen as well no definite obstructive stone seen along the course of the left renal collecting system. Findings may reflect underlying infection given the history of abdominal pain and fever.  Small hiatal hernia noted. No evidence of bowel obstruction. No abnormal wall thickening or inflammatory changes seen about the bowels. Scattered colonic diverticular present without acute  diverticulitis. No evidence for appendicitis.  Bladder not well evaluated due to extensive streak artifact from bilateral total hip arthroplasties. Similarly, pelvic viscera are not well seen.  No free air or fluid. No adenopathy. Moderate aorto bi-iliac atherosclerotic calcifications present. No aneurysm.  No acute osseous abnormality. No worrisome lytic or blastic osseous lesions.  IMPRESSION: 1. Mild left perinephric and periureteral fat stranding, suggestive of possible urinary tract infection. Correlation with urinalysis recommended. No hydronephrosis. 2. No other acute intra-abdominal or pelvic process identified. 3. Colonic diverticulosis without acute diverticulitis. 4. Bilateral hip arthroplasties in place. 5. Moderate aorto bi-iliac atherosclerotic disease. No intra-abdominal aneurysm. 6. Small hiatal hernia.   Electronically Signed   By: Rise Mu M.D.   On: 06/08/2014 05:02   Dg Chest Port 1 View  06/09/2014   CLINICAL DATA:  Check line placement  EXAM: PORTABLE CHEST - 1 VIEW  COMPARISON:  06/08/2014  FINDINGS: Cardiac shadow is stable. A PICC line is now noted on the right. The catheter tip is noted in the mid to distal superior vena cava just above the cavoatrial junction in satisfactory position. No focal infiltrate is seen. No acute bony abnormality is noted.  IMPRESSION: Status post PICC line placement on the right in satisfactory position. No new focal abnormality is seen.   Electronically Signed   By: Alcide Clever M.D.   On: 06/09/2014 11:25   Dg Chest Port 1 View  06/08/2014   CLINICAL DATA:  Shortness of breath  EXAM: PORTABLE CHEST - 1 VIEW  COMPARISON:  06/08/2014 221 hr  FINDINGS: The heart size and mediastinal contours are within normal limits. Both lungs are clear. The visualized skeletal structures are unremarkable.  IMPRESSION: No active disease.   Electronically Signed   By: Alcide Clever M.D.   On: 06/08/2014 17:11   Dg Chest Portable 1 View  06/08/2014   CLINICAL  DATA:  SHORTNESS OF BREATH SHORTNESS OF BREATH  EXAM: PORTABLE CHEST - 1 VIEW  COMPARISON:  None available  FINDINGS: Transverse heart size is at the upper limits of normal. Mediastinal silhouette within normal limits. Atherosclerotic calcifications present within the aortic arch.  The lungs are normally inflated. No airspace consolidation, pleural effusion, or pulmonary edema is identified. There is  no pneumothorax.  No acute osseous abnormality identified.  IMPRESSION: No active cardiopulmonary disease.   Electronically Signed   By: Rise Mu M.D.   On: 06/08/2014 03:00    Microbiology: Recent Results (from the past 240 hour(s))  URINE CULTURE     Status: None   Collection Time    06/08/14  2:31 AM      Result Value Ref Range Status   Specimen Description URINE, CATHETERIZED   Final   Special Requests NONE   Final   Culture  Setup Time     Final   Value: 06/08/2014 08:54     Performed at [facility] Count     Final   Value: >=100,000 COLONIES/ML     Performed at [facility]   Culture     Final   Value: ESCHERICHIA COLI     Performed at [facility]   Report Status 06/10/2014 FINAL   Final   Organism ID, Bacteria ESCHERICHIA COLI   Final  URINE CULTURE     Status: None   Collection Time    06/09/14 12:29 PM      Result Value Ref Range Status   Specimen Description URINE, CLEAN CATCH   Final   Special Requests NONE   Final   Culture  Setup Time     Final   Value: 06/09/2014 18:19     Performed at [facility] Count     Final   Value: NO GROWTH     Performed at [facility]   Culture     Final   Value: NO GROWTH     Performed at [facility]   Report Status 06/10/2014 FINAL   Final     Labs: Basic Metabolic Panel:  Recent Labs Lab 06/08/14 0221 06/09/14 0530 06/10/14 0435  NA 140 139 141  K 4.0 3.5* 3.9  CL 100 101 103  CO2 GLUCOSE 128* 136* 101*  BUN CREATININE 0.80  0.65 1.11*  CALCIUM 10.3 9.1 8.3*   Liver Function Tests:  Recent Labs Lab 06/08/14 0356  AST 20  ALT 12  ALKPHOS 92  BILITOT 0.9  PROT 7.8  ALBUMIN 3.9   No results found for this basename: LIPASE, AMYLASE,  in the last 168 hours No results found for this basename: AMMONIA,  in the last 168 hours CBC:  Recent Labs Lab 06/08/14 0221 06/09/14 0530 06/10/14 0435  WBC 12.5* 10.6* 8.2  NEUTROABS 9.7*  --   --   HGB 13.0 12.0 10.7*  HCT 40.6 38.3 33.9*  MCV 94.9 93.4 95.8  PLT 213 215 169   Cardiac Enzymes:  Recent Labs Lab 06/08/14 0221  TROPONINI <0.30   BNP: BNP (last 3 results) No results found for this basename: PROBNP,  in the last 8760 hours CBG: No results found for this basename: GLUCAP,  in the last 168 hours     Signed:  Jeralyn Bennett  [facility] 06/11/2014, 9:42 AM

## 2014-06-11 NOTE — Discharge Instructions (Signed)

## 2015-03-13 DEATH — deceased

## 2015-07-13 IMAGING — CR DG CHEST 1V PORT
1 series · 1 of 1 positions shown · non-contrast
Comparison: None available

CLINICAL DATA: SHORTNESS OF BREATH SHORTNESS OF BREATH

EXAM:
PORTABLE CHEST - 1 VIEW

[view not recorded]
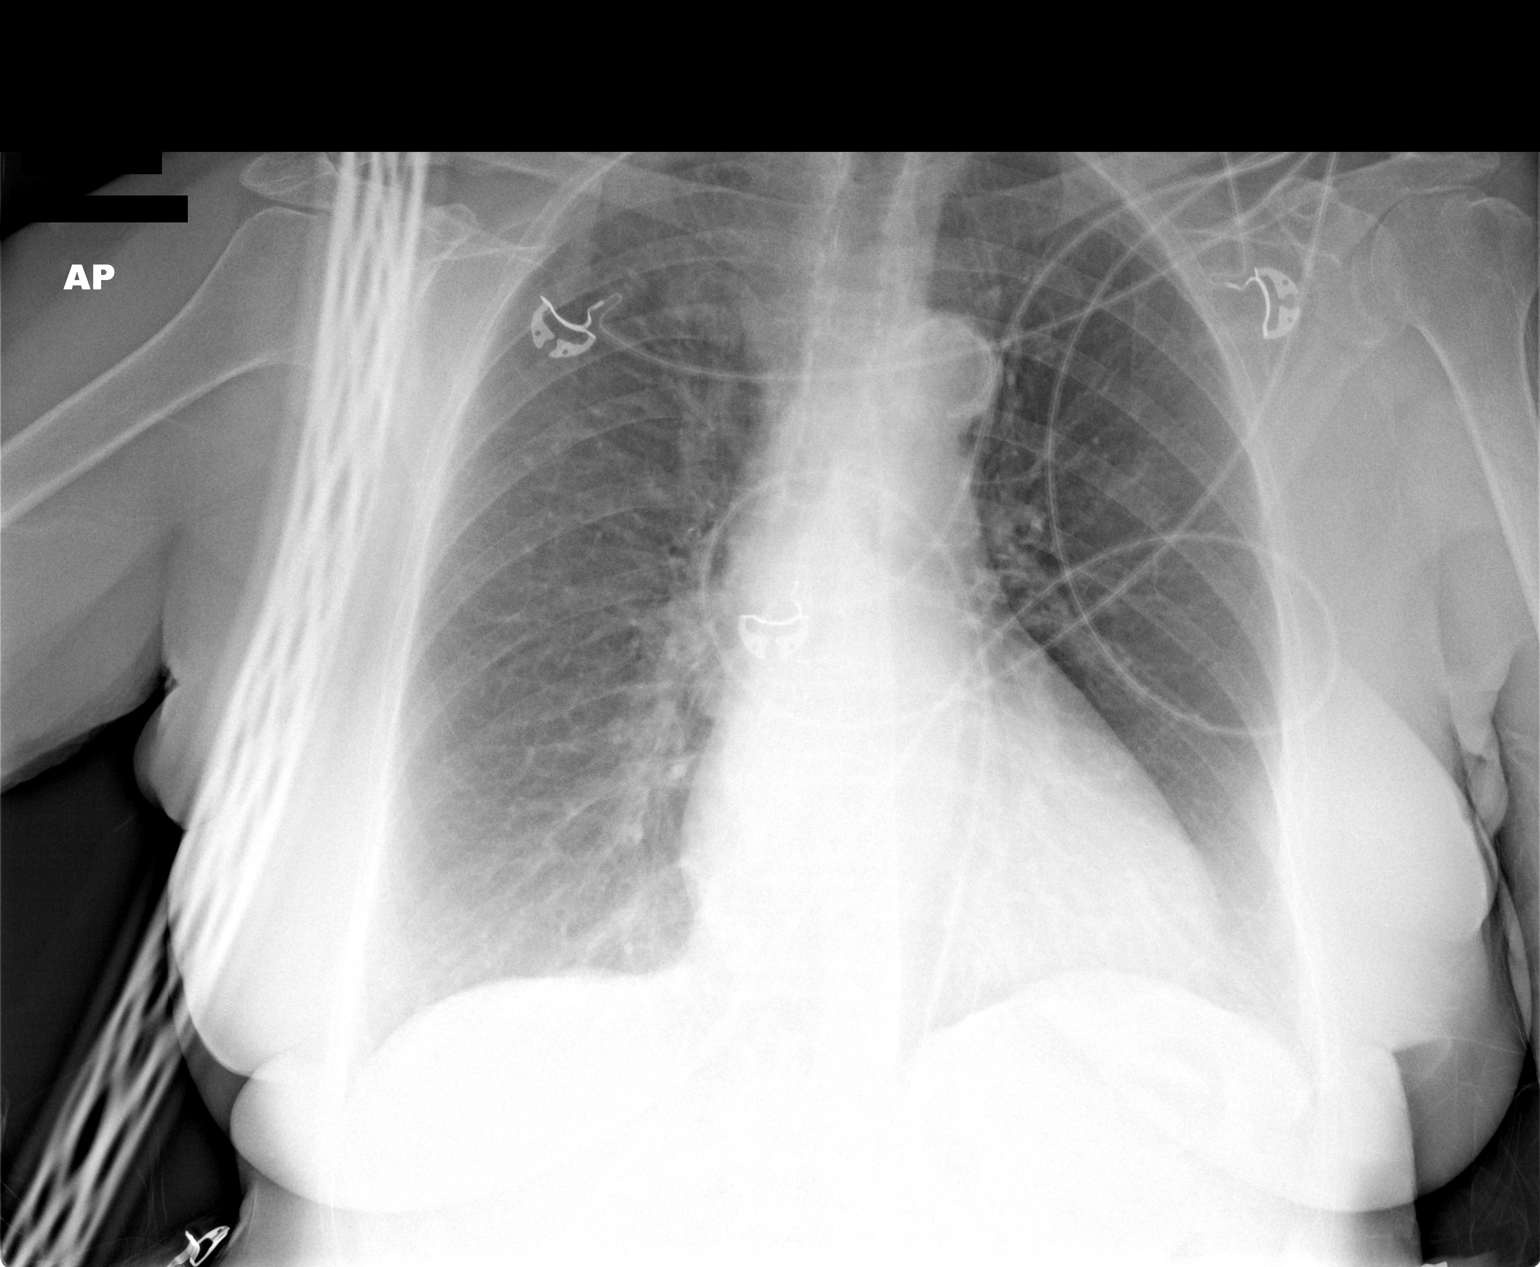

[1 of 1 positions shown; findings below may reference images not displayed]

FINDINGS: Transverse heart size is at the upper limits of normal. Mediastinal
silhouette within normal limits. Atherosclerotic calcifications
present within the aortic arch.

The lungs are normally inflated. No airspace consolidation, pleural
effusion, or pulmonary edema is identified. There is no
pneumothorax.

No acute osseous abnormality identified.
IMPRESSION: No active cardiopulmonary disease.
